# Patient Record
Sex: Female | Born: 1985 | Race: White | Hispanic: No | Marital: Single | Smoking: Never smoker
Health system: Southern US, Community
[De-identification: ages and names within clinical notes are randomized; demographics above are authoritative.]

## PROBLEM LIST (undated history)

## (undated) DIAGNOSIS — E042 Nontoxic multinodular goiter: Secondary | ICD-10-CM

## (undated) DIAGNOSIS — L509 Urticaria, unspecified: Secondary | ICD-10-CM

## (undated) DIAGNOSIS — L309 Dermatitis, unspecified: Secondary | ICD-10-CM

## (undated) DIAGNOSIS — H6990 Unspecified Eustachian tube disorder, unspecified ear: Secondary | ICD-10-CM

## (undated) DIAGNOSIS — E039 Hypothyroidism, unspecified: Secondary | ICD-10-CM

## (undated) DIAGNOSIS — E063 Autoimmune thyroiditis: Secondary | ICD-10-CM

## (undated) DIAGNOSIS — H6980 Other specified disorders of Eustachian tube, unspecified ear: Secondary | ICD-10-CM

## (undated) HISTORY — DX: Autoimmune thyroiditis: E06.3

## (undated) HISTORY — DX: Unspecified eustachian tube disorder, unspecified ear: H69.90

## (undated) HISTORY — DX: Urticaria, unspecified: L50.9

## (undated) HISTORY — DX: Dermatitis, unspecified: L30.9

## (undated) HISTORY — DX: Nontoxic multinodular goiter: E04.2

## (undated) HISTORY — PX: CHOLECYSTECTOMY: SHX55

## (undated) HISTORY — PX: CYSTECTOMY: SUR359

## (undated) HISTORY — PX: WISDOM TOOTH EXTRACTION: SHX21

## (undated) HISTORY — DX: Other specified disorders of Eustachian tube, unspecified ear: H69.80

---

## 2009-05-28 ENCOUNTER — Encounter: Admission: RE | Admit: 2009-05-28 | Discharge: 2009-05-28 | Payer: Self-pay | Admitting: Otolaryngology

## 2012-04-19 DIAGNOSIS — L700 Acne vulgaris: Secondary | ICD-10-CM | POA: Insufficient documentation

## 2012-04-19 DIAGNOSIS — L709 Acne, unspecified: Secondary | ICD-10-CM | POA: Insufficient documentation

## 2012-08-20 HISTORY — PX: THYROIDECTOMY: SHX17

## 2012-11-20 ENCOUNTER — Other Ambulatory Visit: Payer: Self-pay | Admitting: Gastroenterology

## 2012-11-20 DIAGNOSIS — R079 Chest pain, unspecified: Secondary | ICD-10-CM

## 2012-11-30 ENCOUNTER — Ambulatory Visit
Admission: RE | Admit: 2012-11-30 | Discharge: 2012-11-30 | Disposition: A | Discharge status: Home / Self Care | Payer: Managed Care, Other (non HMO) | Source: Ambulatory Visit | Attending: Gastroenterology | Admitting: Gastroenterology

## 2012-11-30 DIAGNOSIS — R079 Chest pain, unspecified: Secondary | ICD-10-CM

## 2012-12-17 ENCOUNTER — Ambulatory Visit
Admission: RE | Admit: 2012-12-17 | Discharge: 2012-12-17 | Disposition: A | Discharge status: Home / Self Care | Payer: Managed Care, Other (non HMO) | Source: Ambulatory Visit | Attending: Gastroenterology | Admitting: Gastroenterology

## 2012-12-17 ENCOUNTER — Other Ambulatory Visit: Payer: Self-pay | Admitting: Gastroenterology

## 2012-12-17 DIAGNOSIS — R079 Chest pain, unspecified: Secondary | ICD-10-CM

## 2014-05-29 ENCOUNTER — Encounter: Payer: Self-pay | Admitting: General Surgery

## 2016-03-11 ENCOUNTER — Ambulatory Visit (INDEPENDENT_AMBULATORY_CARE_PROVIDER_SITE_OTHER): Payer: Managed Care, Other (non HMO) | Admitting: Licensed Clinical Social Worker

## 2016-03-11 DIAGNOSIS — F419 Anxiety disorder, unspecified: Secondary | ICD-10-CM | POA: Diagnosis not present

## 2016-03-25 ENCOUNTER — Ambulatory Visit (INDEPENDENT_AMBULATORY_CARE_PROVIDER_SITE_OTHER): Payer: Managed Care, Other (non HMO) | Admitting: Licensed Clinical Social Worker

## 2016-03-25 DIAGNOSIS — F419 Anxiety disorder, unspecified: Secondary | ICD-10-CM | POA: Diagnosis not present

## 2016-04-11 ENCOUNTER — Ambulatory Visit: Payer: Managed Care, Other (non HMO) | Admitting: Licensed Clinical Social Worker

## 2016-04-22 ENCOUNTER — Ambulatory Visit: Payer: Managed Care, Other (non HMO) | Admitting: Licensed Clinical Social Worker

## 2016-05-06 ENCOUNTER — Ambulatory Visit (INDEPENDENT_AMBULATORY_CARE_PROVIDER_SITE_OTHER): Payer: 59 | Admitting: Licensed Clinical Social Worker

## 2016-05-06 DIAGNOSIS — F419 Anxiety disorder, unspecified: Secondary | ICD-10-CM | POA: Diagnosis not present

## 2016-05-06 LAB — OB RESULTS CONSOLE GC/CHLAMYDIA
CHLAMYDIA, DNA PROBE: NEGATIVE
Gonorrhea: NEGATIVE

## 2016-06-03 LAB — OB RESULTS CONSOLE ABO/RH: RH Type: NEGATIVE

## 2016-06-03 LAB — OB RESULTS CONSOLE ANTIBODY SCREEN: ANTIBODY SCREEN: NEGATIVE

## 2016-06-14 ENCOUNTER — Ambulatory Visit: Payer: Managed Care, Other (non HMO) | Admitting: Licensed Clinical Social Worker

## 2016-06-27 ENCOUNTER — Other Ambulatory Visit (HOSPITAL_COMMUNITY): Payer: Self-pay | Admitting: Obstetrics & Gynecology

## 2016-06-27 DIAGNOSIS — Z3689 Encounter for other specified antenatal screening: Secondary | ICD-10-CM

## 2016-06-27 DIAGNOSIS — Z3A18 18 weeks gestation of pregnancy: Secondary | ICD-10-CM

## 2016-06-27 DIAGNOSIS — R9389 Abnormal findings on diagnostic imaging of other specified body structures: Secondary | ICD-10-CM

## 2016-06-28 ENCOUNTER — Ambulatory Visit (HOSPITAL_COMMUNITY)
Admission: RE | Admit: 2016-06-28 | Discharge: 2016-06-28 | Disposition: A | Discharge status: Home / Self Care | Payer: Managed Care, Other (non HMO) | Source: Ambulatory Visit | Attending: Obstetrics & Gynecology | Admitting: Obstetrics & Gynecology

## 2016-06-28 ENCOUNTER — Encounter (HOSPITAL_COMMUNITY): Payer: Self-pay | Admitting: *Deleted

## 2016-06-28 DIAGNOSIS — Z363 Encounter for antenatal screening for malformations: Secondary | ICD-10-CM | POA: Insufficient documentation

## 2016-06-28 DIAGNOSIS — Z3A18 18 weeks gestation of pregnancy: Secondary | ICD-10-CM | POA: Diagnosis not present

## 2016-06-28 DIAGNOSIS — O358XX0 Maternal care for other (suspected) fetal abnormality and damage, not applicable or unspecified: Secondary | ICD-10-CM | POA: Diagnosis present

## 2016-06-28 DIAGNOSIS — Z3689 Encounter for other specified antenatal screening: Secondary | ICD-10-CM

## 2016-06-28 DIAGNOSIS — R9389 Abnormal findings on diagnostic imaging of other specified body structures: Secondary | ICD-10-CM

## 2016-06-28 NOTE — Addendum Note (Signed)
Encounter addended by: Lenoard Adenlivia M Nahomy Limburg, RT on: 06/28/2016  4:37 PM<BR>    Actions taken: Imaging Exam ended

## 2016-06-29 ENCOUNTER — Other Ambulatory Visit (HOSPITAL_COMMUNITY): Payer: Self-pay | Admitting: *Deleted

## 2016-06-29 DIAGNOSIS — IMO0002 Reserved for concepts with insufficient information to code with codable children: Secondary | ICD-10-CM

## 2016-06-29 DIAGNOSIS — Z0489 Encounter for examination and observation for other specified reasons: Secondary | ICD-10-CM

## 2016-06-30 ENCOUNTER — Encounter (HOSPITAL_COMMUNITY): Payer: Self-pay

## 2016-07-26 ENCOUNTER — Ambulatory Visit (HOSPITAL_COMMUNITY)
Admission: RE | Admit: 2016-07-26 | Discharge: 2016-07-26 | Disposition: A | Discharge status: Home / Self Care | Payer: Managed Care, Other (non HMO) | Source: Ambulatory Visit | Attending: Obstetrics & Gynecology | Admitting: Obstetrics & Gynecology

## 2016-07-26 ENCOUNTER — Encounter (HOSPITAL_COMMUNITY): Payer: Self-pay

## 2016-07-26 DIAGNOSIS — Z3A22 22 weeks gestation of pregnancy: Secondary | ICD-10-CM | POA: Diagnosis not present

## 2016-07-26 DIAGNOSIS — Z362 Encounter for other antenatal screening follow-up: Secondary | ICD-10-CM | POA: Diagnosis present

## 2016-07-26 DIAGNOSIS — Z0489 Encounter for examination and observation for other specified reasons: Secondary | ICD-10-CM

## 2016-07-26 DIAGNOSIS — IMO0002 Reserved for concepts with insufficient information to code with codable children: Secondary | ICD-10-CM

## 2016-08-17 ENCOUNTER — Ambulatory Visit: Payer: Self-pay | Admitting: Licensed Clinical Social Worker

## 2016-08-29 NOTE — L&D Delivery Note (Signed)
Delivery Note  First Stage: Labor onset: 11/28/16 Augmentation : pitocin, AROM  Analgesia /Anesthesia intrapartum: epidural  AROM at 2030  Second Stage: Complete dilation at 0213 Onset of pushing at 0214 FHR second stage Cat 1   Delivery of a viable female at 21 by SNM in ROA compound position no nuchal cord Cord double clamped after cessation of pulsation, cut by FOB Cord blood sample collected   Third Stage: Placenta delivered [facility] intact with 3 VC @ 0409 Placenta disposition: L&D Uterine tone firm / bleeding light  2nd degree laceration identified  Anesthesia for repair: epidural Repair 3.0 vicryl and 4.0 vicryl  Est. Blood Loss (mL): 50  Complications: prodromal labor in early phase- prolonged, compound presentation   Mom to postpartum.  Baby to Couplet care / Skin to Skin.  Newborn: Baby Name: Hank  Birth Weight: pending  Apgar Scores: 9/9 Feeding planned: breast  Steward Drone BSN, SNM 12/01/2016, 4:52 AM

## 2016-09-05 ENCOUNTER — Ambulatory Visit (INDEPENDENT_AMBULATORY_CARE_PROVIDER_SITE_OTHER): Payer: 59 | Admitting: Licensed Clinical Social Worker

## 2016-09-05 DIAGNOSIS — F419 Anxiety disorder, unspecified: Secondary | ICD-10-CM

## 2016-09-05 LAB — OB RESULTS CONSOLE RPR: RPR: NONREACTIVE

## 2016-09-05 LAB — OB RESULTS CONSOLE HEPATITIS B SURFACE ANTIGEN: Hepatitis B Surface Ag: NEGATIVE

## 2016-09-05 LAB — OB RESULTS CONSOLE HIV ANTIBODY (ROUTINE TESTING): HIV: NONREACTIVE

## 2016-09-15 ENCOUNTER — Encounter (HOSPITAL_COMMUNITY): Payer: Self-pay

## 2016-10-05 ENCOUNTER — Ambulatory Visit (INDEPENDENT_AMBULATORY_CARE_PROVIDER_SITE_OTHER): Payer: 59 | Admitting: Licensed Clinical Social Worker

## 2016-10-05 DIAGNOSIS — F419 Anxiety disorder, unspecified: Secondary | ICD-10-CM

## 2016-10-27 ENCOUNTER — Ambulatory Visit: Payer: 59 | Admitting: Licensed Clinical Social Worker

## 2016-10-27 ENCOUNTER — Ambulatory Visit: Payer: Self-pay | Admitting: Licensed Clinical Social Worker

## 2016-11-04 LAB — OB RESULTS CONSOLE GBS: STREP GROUP B AG: NEGATIVE

## 2016-11-10 ENCOUNTER — Ambulatory Visit: Payer: 59 | Admitting: Licensed Clinical Social Worker

## 2016-11-30 ENCOUNTER — Encounter (HOSPITAL_COMMUNITY): Payer: Self-pay

## 2016-11-30 ENCOUNTER — Inpatient Hospital Stay (HOSPITAL_COMMUNITY): Payer: Managed Care, Other (non HMO) | Admitting: Anesthesiology

## 2016-11-30 ENCOUNTER — Inpatient Hospital Stay (HOSPITAL_COMMUNITY)
Admission: AD | Admit: 2016-11-30 | Discharge: 2016-12-03 | DRG: 775 | Disposition: A | Discharge status: Home / Self Care | Payer: Managed Care, Other (non HMO) | Source: Ambulatory Visit | Attending: Obstetrics & Gynecology | Admitting: Obstetrics & Gynecology

## 2016-11-30 DIAGNOSIS — O872 Hemorrhoids in the puerperium: Secondary | ICD-10-CM | POA: Diagnosis not present

## 2016-11-30 DIAGNOSIS — O99284 Endocrine, nutritional and metabolic diseases complicating childbirth: Secondary | ICD-10-CM | POA: Diagnosis present

## 2016-11-30 DIAGNOSIS — Z8249 Family history of ischemic heart disease and other diseases of the circulatory system: Secondary | ICD-10-CM

## 2016-11-30 DIAGNOSIS — O326XX0 Maternal care for compound presentation, not applicable or unspecified: Secondary | ICD-10-CM | POA: Diagnosis present

## 2016-11-30 DIAGNOSIS — E039 Hypothyroidism, unspecified: Secondary | ICD-10-CM | POA: Diagnosis present

## 2016-11-30 DIAGNOSIS — O480 Post-term pregnancy: Secondary | ICD-10-CM | POA: Diagnosis present

## 2016-11-30 DIAGNOSIS — Z3A40 40 weeks gestation of pregnancy: Secondary | ICD-10-CM | POA: Diagnosis not present

## 2016-11-30 DIAGNOSIS — Z823 Family history of stroke: Secondary | ICD-10-CM | POA: Diagnosis not present

## 2016-11-30 LAB — ABO/RH: ABO/RH(D): O NEG

## 2016-11-30 LAB — TYPE AND SCREEN
ABO/RH(D): O NEG
Antibody Screen: NEGATIVE

## 2016-11-30 LAB — CBC
HCT: 38.7 % (ref 36.0–46.0)
Hemoglobin: 13.5 g/dL (ref 12.0–15.0)
MCH: 31.4 pg (ref 26.0–34.0)
MCHC: 34.9 g/dL (ref 30.0–36.0)
MCV: 90 fL (ref 78.0–100.0)
Platelets: 195 10*3/uL (ref 150–400)
RBC: 4.3 MIL/uL (ref 3.87–5.11)
RDW: 13.6 % (ref 11.5–15.5)
WBC: 13.4 10*3/uL — ABNORMAL HIGH (ref 4.0–10.5)

## 2016-11-30 MED ORDER — FENTANYL 2.5 MCG/ML BUPIVACAINE 1/10 % EPIDURAL INFUSION (WH - ANES)
INTRAMUSCULAR | Status: AC
  Filled 2016-11-30: qty 100

## 2016-11-30 MED ORDER — LIDOCAINE HCL (PF) 1 % IJ SOLN
30.0000 mL | INTRAMUSCULAR | Status: DC | PRN
  Filled 2016-11-30: qty 30

## 2016-11-30 MED ORDER — LACTATED RINGERS IV SOLN: 500.0000 mL | INTRAVENOUS | Status: DC | PRN

## 2016-11-30 MED ORDER — OXYTOCIN BOLUS FROM INFUSION: 500.0000 mL | Freq: Once | INTRAVENOUS | Status: DC

## 2016-11-30 MED ORDER — OXYTOCIN 40 UNITS IN LACTATED RINGERS INFUSION - SIMPLE MED
1.0000 m[IU]/min | INTRAVENOUS | Status: DC
  Administered 2016-11-30: 2 m[IU]/min via INTRAVENOUS
  Administered 2016-12-01: 8 m[IU]/min via INTRAVENOUS
  Administered 2016-12-01: 6 m[IU]/min via INTRAVENOUS

## 2016-11-30 MED ORDER — DIPHENHYDRAMINE HCL 50 MG/ML IJ SOLN: 12.5000 mg | INTRAMUSCULAR | Status: DC | PRN

## 2016-11-30 MED ORDER — OXYCODONE-ACETAMINOPHEN 5-325 MG PO TABS
1.0000 | ORAL_TABLET | ORAL | Status: DC | PRN
Start: 2016-11-30 — End: 2016-12-01

## 2016-11-30 MED ORDER — FENTANYL 2.5 MCG/ML BUPIVACAINE 1/10 % EPIDURAL INFUSION (WH - ANES)
14.0000 mL/h | INTRAMUSCULAR | Status: DC | PRN
  Administered 2016-11-30 – 2016-12-01 (×2): 14 mL/h via EPIDURAL
  Filled 2016-11-30: qty 100

## 2016-11-30 MED ORDER — SOD CITRATE-CITRIC ACID 500-334 MG/5ML PO SOLN: 30.0000 mL | ORAL | Status: DC | PRN

## 2016-11-30 MED ORDER — PROMETHAZINE HCL 25 MG/ML IJ SOLN
25.0000 mg | Freq: Once | INTRAMUSCULAR | Status: AC
  Administered 2016-11-30: 25 mg via INTRAVENOUS
  Filled 2016-11-30: qty 1

## 2016-11-30 MED ORDER — BUTORPHANOL TARTRATE 1 MG/ML IJ SOLN
2.0000 mg | Freq: Once | INTRAMUSCULAR | Status: AC
  Administered 2016-11-30: 2 mg via INTRAVENOUS
  Filled 2016-11-30: qty 2

## 2016-11-30 MED ORDER — LIDOCAINE HCL (PF) 1 % IJ SOLN
INTRAMUSCULAR | Status: DC | PRN
  Administered 2016-11-30: 13 mL via EPIDURAL

## 2016-11-30 MED ORDER — EPHEDRINE 5 MG/ML INJ
10.0000 mg | INTRAVENOUS | Status: DC | PRN
  Filled 2016-11-30: qty 2

## 2016-11-30 MED ORDER — OXYCODONE-ACETAMINOPHEN 5-325 MG PO TABS
2.0000 | ORAL_TABLET | ORAL | Status: DC | PRN
Start: 2016-11-30 — End: 2016-12-01

## 2016-11-30 MED ORDER — LACTATED RINGERS IV SOLN
INTRAVENOUS | Status: DC
  Administered 2016-11-30 – 2016-12-01 (×3): via INTRAVENOUS

## 2016-11-30 MED ORDER — PHENYLEPHRINE 40 MCG/ML (10ML) SYRINGE FOR IV PUSH (FOR BLOOD PRESSURE SUPPORT)
PREFILLED_SYRINGE | INTRAVENOUS | Status: AC
  Filled 2016-11-30: qty 20

## 2016-11-30 MED ORDER — LACTATED RINGERS IV SOLN
500.0000 mL | Freq: Once | INTRAVENOUS | Status: AC
  Administered 2016-11-30: 500 mL via INTRAVENOUS

## 2016-11-30 MED ORDER — PHENYLEPHRINE 40 MCG/ML (10ML) SYRINGE FOR IV PUSH (FOR BLOOD PRESSURE SUPPORT)
80.0000 ug | PREFILLED_SYRINGE | INTRAVENOUS | Status: DC | PRN
  Filled 2016-11-30: qty 5

## 2016-11-30 MED ORDER — ACETAMINOPHEN 325 MG PO TABS
650.0000 mg | ORAL_TABLET | ORAL | Status: DC | PRN
  Administered 2016-12-01: 650 mg via ORAL
  Filled 2016-11-30: qty 2

## 2016-11-30 MED ORDER — TERBUTALINE SULFATE 1 MG/ML IJ SOLN
0.2500 mg | Freq: Once | INTRAMUSCULAR | Status: DC | PRN
  Filled 2016-11-30: qty 1

## 2016-11-30 MED ORDER — OXYTOCIN 40 UNITS IN LACTATED RINGERS INFUSION - SIMPLE MED
2.5000 [IU]/h | INTRAVENOUS | Status: DC
  Filled 2016-11-30: qty 1000

## 2016-11-30 NOTE — Anesthesia Pain Management Evaluation Note (Signed)
  CRNA Pain Management Visit Note  Patient: Debbie Patel, 31 y.o., female  "Hello I am a member of the anesthesia team at [facility]. We have an anesthesia team available at all times to provide care throughout the hospital, including epidural management and anesthesia for C-section. I don't know your plan for the delivery whether it a natural birth, water birth, IV sedation, nitrous supplementation, doula or epidural, but we want to meet your pain goals."   1.Was your pain managed to your expectations on prior hospitalizations?   No   2.What is your expectation for pain management during this hospitalization?     Epidural  3.How can we help you reach that goal? ' Epidural when I reach my threshold."  Record the patient's initial score and the patient's pain goal.   Pain: 2  Pain Goal: 8 [facility] wants you to be able to say your pain was always managed very well.  Leeland Lovelady 11/30/2016

## 2016-11-30 NOTE — H&P (Signed)
OB ADMISSION/ HISTORY & PHYSICAL:  Admission Date: 11/30/2016 10:51 AM  Admit Diagnosis: Post dates pregnancy   Debbie Patel is a 31 y.o. female presenting for Post dates pregnancy- prodromal labor.  Prenatal History: G1P0   EDC : 11/26/2016, by Last Menstrual Period  Prenatal care at [facility]  Primary Ob Provider: Marlinda Mike  Prenatal course complicated by hypothyroidism   Prenatal Labs: ABO, Rh: --/--/O NEG, O NEG (04/04 1200) Antibody: NEG (04/04 1200) Rubella:   Immune (05/06/2016)  RPR: Nonreactive (01/08 0000)  HBsAg: Negative (01/08 0000)  HIV: Non-reactive (01/08 0000)  GTT: 89- 1 hour (01/08 0000) GBS: Negative (03/09 0000)   Medical / Surgical History :  Past medical history:  Past Medical History:  Diagnosis Date  . Eustachian tube dysfunction   . Hashimoto's thyroiditis   . Nontoxic multinodular goiter      Past surgical history:  Past Surgical History:  Procedure Laterality Date  . THYROIDECTOMY  08/20/2012  . WISDOM TOOTH EXTRACTION      Family History:  Family History  Problem Relation Age of Onset  . Hypertension Father   . CVA Maternal Grandmother   . Hypothyroidism Maternal Grandmother   . Alzheimer's disease Maternal Grandfather   . Alzheimer's disease Paternal Grandmother   . CAD Paternal Grandfather   . CVA Paternal Grandfather      Social History:  reports that she has never smoked. She has never used smokeless tobacco. She reports that she drinks alcohol. She reports that she does not use drugs.   Allergies: Other    Current Medications at time of admission:  Prior to Admission medications   Medication Sig Start Date End Date Taking? Authorizing Provider  Doxylamine Succinate, Sleep, (UNISOM PO) Take by mouth.    Historical Provider, MD  Folic Acid-Vit B6-Vit B12 (FOLBEE) 2.5-25-1 MG TABS tablet Take 1 tablet by mouth daily.    Historical Provider, MD  levonorgestrel-ethinyl estradiol (NORDETTE) 0.15-30  MG-MCG tablet Take 1 tablet by mouth daily.    Historical Provider, MD  levothyroxine (SYNTHROID, LEVOTHROID) 150 MCG tablet Take 200 mcg by mouth daily before breakfast.     Historical Provider, MD  levothyroxine (SYNTHROID, LEVOTHROID) 200 MCG tablet Take 200 mcg by mouth daily before breakfast. 05/28/16   Historical Provider, MD  Multiple Vitamin (MULTIVITAMIN) tablet Take 1 tablet by mouth daily.    Historical Provider, MD  Prenatal Vit w/Fe-Methylfol-FA (PNV PO) Take by mouth.    Historical Provider, MD     Review of Systems: Active FM onset of ctx on 11/29/16 currently every 5 minutes No LOF No bloody show   Physical Exam:  VS: Blood pressure 125/90, pulse 76, temperature 98.2 F (36.8 C), temperature source Oral, resp. rate 18, height  (1.803 m), weight 99.8 kg (220 lb), last menstrual period 02/20/2016.  General: alert and oriented, appears to feel better after rest  Heart: RRR Lungs: Clear lung fields Abdomen: Gravid, soft and non-tender, non-distended Extremities: mild pedal edema  Genitalia / VE:   Dilation: 2-3  FHR: baseline rate 125 / variability mod / accelerations present / no decelerations TOCO: 5 minutes with UI  Assessment: 40.[redacted] weeks gestation Early stage of labor FHR category 1   Plan:  IV Pain medication Expectant management   Dr Juliene Pina notified of admission / plan of care   Steward Drone BSN, SNM 11/30/2016, 5:56 PM

## 2016-11-30 NOTE — Progress Notes (Signed)
S:  Slept well x 3-4 hours       Ctx tight and painful  O:  VS: Blood pressure 125/90, pulse 76, temperature 98.2 F (36.8 C), temperature source Oral, resp. rate 18, height  (1.803 m), weight 99.8 kg (220 lb), last menstrual period 02/20/2016.        FHR : baseline 125 / variability moderate / accelerations + / no decelerations        Toco: contractions every 5 minutes / UI         Cervix : same: 2-3cm / 80% / vtx 0        Membranes: intact  A: prodromal labor     FHR category 1  P: discussed options - desires to proceed with active management & start pitocin with AROM / wants epidural      Marlinda Mike CNM, MSN, [facility] 11/30/2016, 6:22 PM

## 2016-11-30 NOTE — Anesthesia Preprocedure Evaluation (Signed)
Anesthesia Evaluation  Patient identified by MRN, date of birth, ID band Patient awake    Reviewed: Allergy & Precautions, NPO status , Patient's Chart, lab work & pertinent test results  Airway Mallampati: II  TM Distance: >3 FB Neck ROM: Full    Dental no notable dental hx.    Pulmonary neg pulmonary ROS,    Pulmonary exam normal breath sounds clear to auscultation       Cardiovascular negative cardio ROS Normal cardiovascular exam Rhythm:Regular Rate:Normal     Neuro/Psych negative neurological ROS  negative psych ROS   GI/Hepatic negative GI ROS, Neg liver ROS,   Endo/Other  negative endocrine ROS  Renal/GU negative Renal ROS  negative genitourinary   Musculoskeletal negative musculoskeletal ROS (+)   Abdominal   Peds negative pediatric ROS (+)  Hematology negative hematology ROS (+)   Anesthesia Other Findings   Reproductive/Obstetrics negative OB ROS (+) Pregnancy                             Anesthesia Physical Anesthesia Plan  ASA: II  Anesthesia Plan: Epidural   Post-op Pain Management:    Induction:   Airway Management Planned:   Additional Equipment:   Intra-op Plan:   Post-operative Plan:   Informed Consent:   Plan Discussed with:   Anesthesia Plan Comments:         Anesthesia Quick Evaluation  

## 2016-11-30 NOTE — Progress Notes (Signed)
S:  Some contraction becoming painful - wants epidural  O:  VS: Blood pressure 132/79, pulse 82, temperature 98.2 F (36.8 C), resp. rate 20, height  (1.803 m), weight 99.8 kg (220 lb), last menstrual period 02/20/2016.        FHR : baseline 125 / variability moderate / accelerations + / no decelerations        Toco: contractions every 2-3 minutes / moderate         Cervix : 3cm / 90 vtx 0        Membranes: AROM - moderate clear  A: active labor     FHR category 1  P: epidural     Marlinda Mike CNM, MSN, FACNM 11/30/2016, 8:31 PM

## 2016-11-30 NOTE — Anesthesia Procedure Notes (Signed)
Epidural Patient location during procedure: OB Start time: 11/30/2016 9:11 PM End time: 11/30/2016 9:31 PM  Staffing Anesthesiologist: Anitra Lauth RAY Performed: anesthesiologist   Preanesthetic Checklist Completed: patient identified, site marked, surgical consent, pre-op evaluation, timeout performed, IV checked, risks and benefits discussed and monitors and equipment checked  Epidural Patient position: sitting Prep: DuraPrep Patient monitoring: heart rate, cardiac monitor, continuous pulse ox and blood pressure Approach: midline Location: L2-L3 Injection technique: LOR saline  Needle:  Needle type: Tuohy  Needle gauge: 17 G Needle length: 9 cm Needle insertion depth: 5.5 cm Catheter type: closed end flexible Catheter size: 20 Guage Catheter at skin depth: 8.5 cm Test dose: negative  Assessment Events: blood not aspirated, injection not painful, no injection resistance, negative IV test and no paresthesia  Additional Notes Reason for block:procedure for pain

## 2016-12-01 ENCOUNTER — Encounter (HOSPITAL_COMMUNITY): Payer: Self-pay | Admitting: *Deleted

## 2016-12-01 LAB — CBC
HCT: 34.5 % — ABNORMAL LOW (ref 36.0–46.0)
Hemoglobin: 12.1 g/dL (ref 12.0–15.0)
MCH: 31.8 pg (ref 26.0–34.0)
MCHC: 35.1 g/dL (ref 30.0–36.0)
MCV: 90.6 fL (ref 78.0–100.0)
Platelets: 155 10*3/uL (ref 150–400)
RBC: 3.81 MIL/uL — ABNORMAL LOW (ref 3.87–5.11)
RDW: 13.6 % (ref 11.5–15.5)
WBC: 22.3 10*3/uL — ABNORMAL HIGH (ref 4.0–10.5)

## 2016-12-01 LAB — RPR: RPR Ser Ql: NONREACTIVE

## 2016-12-01 MED ORDER — DIBUCAINE 1 % RE OINT: 1.0000 "application " | TOPICAL_OINTMENT | RECTAL | Status: DC | PRN

## 2016-12-01 MED ORDER — IBUPROFEN 600 MG PO TABS
600.0000 mg | ORAL_TABLET | Freq: Four times a day (QID) | ORAL | Status: DC
  Administered 2016-12-01 – 2016-12-03 (×10): 600 mg via ORAL
  Filled 2016-12-01 (×10): qty 1

## 2016-12-01 MED ORDER — SENNOSIDES-DOCUSATE SODIUM 8.6-50 MG PO TABS
2.0000 | ORAL_TABLET | ORAL | Status: DC
  Administered 2016-12-01 – 2016-12-02 (×2): 2 via ORAL
  Filled 2016-12-01 (×2): qty 2

## 2016-12-01 MED ORDER — PANTOPRAZOLE SODIUM 40 MG PO TBEC
40.0000 mg | DELAYED_RELEASE_TABLET | Freq: Every day | ORAL | Status: DC
  Filled 2016-12-01 (×2): qty 1

## 2016-12-01 MED ORDER — BENZOCAINE-MENTHOL 20-0.5 % EX AERO
1.0000 "application " | INHALATION_SPRAY | CUTANEOUS | Status: DC | PRN
  Administered 2016-12-01: 1 via TOPICAL
  Filled 2016-12-01: qty 56

## 2016-12-01 MED ORDER — ACETAMINOPHEN 325 MG PO TABS: 650.0000 mg | ORAL_TABLET | ORAL | Status: DC | PRN

## 2016-12-01 MED ORDER — LEVOTHYROXINE SODIUM 175 MCG PO TABS
175.0000 ug | ORAL_TABLET | Freq: Every day | ORAL | Status: DC
  Administered 2016-12-01 – 2016-12-03 (×3): 175 ug via ORAL
  Filled 2016-12-01 (×4): qty 1

## 2016-12-01 MED ORDER — DIPHENHYDRAMINE HCL 25 MG PO CAPS: 25.0000 mg | ORAL_CAPSULE | Freq: Four times a day (QID) | ORAL | Status: DC | PRN

## 2016-12-01 MED ORDER — ZOLPIDEM TARTRATE 5 MG PO TABS: 5.0000 mg | ORAL_TABLET | Freq: Every evening | ORAL | Status: DC | PRN

## 2016-12-01 MED ORDER — HYDROCORTISONE ACE-PRAMOXINE 1-1 % RE CREA
TOPICAL_CREAM | Freq: Four times a day (QID) | RECTAL | Status: DC
Start: 2016-12-01 — End: 2016-12-01
  Filled 2016-12-01: qty 30

## 2016-12-01 MED ORDER — ONDANSETRON HCL 4 MG PO TABS: 4.0000 mg | ORAL_TABLET | ORAL | Status: DC | PRN

## 2016-12-01 MED ORDER — SIMETHICONE 80 MG PO CHEW: 80.0000 mg | CHEWABLE_TABLET | ORAL | Status: DC | PRN

## 2016-12-01 MED ORDER — COCONUT OIL OIL
1.0000 "application " | TOPICAL_OIL | Status: DC | PRN
  Administered 2016-12-03: 1 via TOPICAL
  Filled 2016-12-01: qty 120

## 2016-12-01 MED ORDER — WITCH HAZEL-GLYCERIN EX PADS
1.0000 "application " | MEDICATED_PAD | CUTANEOUS | Status: DC | PRN
  Administered 2016-12-01: 1 via TOPICAL

## 2016-12-01 MED ORDER — ONDANSETRON HCL 4 MG/2ML IJ SOLN: 4.0000 mg | INTRAMUSCULAR | Status: DC | PRN

## 2016-12-01 MED ORDER — HYDROCORTISONE ACE-PRAMOXINE 1-1 % RE FOAM
1.0000 | Freq: Four times a day (QID) | RECTAL | Status: DC
  Administered 2016-12-01 – 2016-12-03 (×5): 1 via RECTAL
  Filled 2016-12-01: qty 10

## 2016-12-01 NOTE — Lactation Note (Signed)
This note was copied from a baby's chart. Lactation Consultation Note  Patient Name: Debbie Patel [id number] Date: 12/01/2016 Reason for consult: Follow-up assessment (10 Hours old, encouraged to page with feeding cues )  Baby is 10 hours old and has been to the breast 3 x's since birth.  Per [facility] RN - observed a latch this am and swallows  Noted, Latch score 7 .  [facility] RN - mentioned once the baby latched and got into a good pattern latched well.  Per mom attended the breast feeding classes at [facility] , and had breast feeding questions , along with dad. LC answered questions , and reviewed basics, and potential feeding behaviors.  Importance of STS , depth latch . LC encouraged mom to call with feeding cues for a feeding assessment.  Mother informed of post-discharge support and given phone number to the lactation department, including services for phone call assistance; out-patient appointments; and breastfeeding support group. List of other breastfeeding resources in the community given in the handout. Encouraged mother to call for problems or concerns related to breastfeeding.   Maternal Data Has patient been taught Hand Expression?:  (per mom familiar ) Does the patient have breastfeeding experience prior to this delivery?: No  Feeding Feeding Type:  (per mom attempted at 1300 , few sucks , baby sleepy ) Length of feed:  (few sucks )  LATCH Score/Interventions                Intervention(s): Breastfeeding basics reviewed     Lactation Tools Discussed/Used     Consult Status Consult Status: Follow-up Date: 12/01/16 Follow-up type: In-patient    Matilde Sprang Dodi Leu 12/01/2016, 2:09 PM

## 2016-12-01 NOTE — Lactation Note (Signed)
This note was copied from a baby's chart. Lactation Consultation Note  Patient Name: Debbie Patel [id number] Date: 12/01/2016 Reason for consult: Follow-up assessment Mom called for assist with latching baby. After demonstrating suck training and using breast compression baby was able to latch and demonstrated a good suckling pattern. Basic teaching reviewed with Mom, encouraged to BF with feeding que, cluster feeding discussed. Mom to call for assist as needed with latch.   Maternal Data    Feeding Feeding Type: Breast Fed Length of feed: 15 min  LATCH Score/Interventions Latch: Repeated attempts needed to sustain latch, nipple held in mouth throughout feeding, stimulation needed to elicit sucking reflex. Intervention(s): Adjust position;Assist with latch;Breast massage;Breast compression  Audible Swallowing: A few with stimulation  Type of Nipple: Everted at rest and after stimulation  Comfort (Breast/Nipple): Soft / non-tender     Hold (Positioning): Assistance needed to correctly position infant at breast and maintain latch. Intervention(s): Breastfeeding basics reviewed;Support Pillows;Position options;Skin to skin  LATCH Score: 7  Lactation Tools Discussed/Used     Consult Status Consult Status: Follow-up Date: 12/02/16 Follow-up type: In-patient    Alfred Levins 12/01/2016, 6:00 PM

## 2016-12-01 NOTE — Anesthesia Postprocedure Evaluation (Signed)
Anesthesia Post Note  Patient: Stephonie Wilcoxen  Procedure(s) Performed: * No procedures listed *  Patient location during evaluation: Mother Baby Anesthesia Type: Epidural Level of consciousness: awake Pain management: satisfactory to patient Vital Signs Assessment: post-procedure vital signs reviewed and stable Respiratory status: spontaneous breathing Cardiovascular status: stable Anesthetic complications: no        Last Vitals:  Vitals:   12/01/16 0820 12/01/16 1215  BP: 125/76 116/70  Pulse: 84 82  Resp: 18 16  Temp: 36.6 C 36.7 C    Last Pain:  Vitals:   12/01/16 1215  TempSrc: Oral  PainSc: 2    Pain Goal: Patients Stated Pain Goal: 2 (12/01/16 1215)               Cephus Shelling

## 2016-12-01 NOTE — Plan of Care (Signed)
Problem: Education: Goal: Knowledge of condition will improve Admission paperwork, safety and unit protocols reviewed with patient and significant other. Patient verbalizes understanding of information. Encouraged patient to call for staff assistance for the first time ambulating.

## 2016-12-01 NOTE — Progress Notes (Signed)
Patient transferred to [facility] rm 101

## 2016-12-02 NOTE — Progress Notes (Signed)
Post Partum Day 1, SVD (CNM care) on 4/5 AM. BOY.  Subjective: no complaints, up ad lib, voiding, tolerating PO and + flatus Some perineal pain and hemorrhoids related discomfort Bleeding better after passing a few clots immediate pp   Objective: Blood pressure 117/61, pulse 61, temperature 98.2 F (36.8 C), temperature source Oral, resp. rate 18, height  (1.803 m), weight 220 lb (99.8 kg), last menstrual period 02/20/2016, SpO2 98 %, unknown if currently breastfeeding.  Physical Exam:  General: alert and cooperative Lochia: appropriate Uterine Fundus: firm Incision: ok DVT Evaluation: No evidence of DVT seen on physical exam.   Recent Labs  11/30/16 1200 12/01/16 0726  HGB 13.5 12.1  HCT 38.7 34.5*   O(-), Baby also O(-)  Defer rhogam Rub Immune    Assessment/Plan: PPD1. Healthy mother, uncomplicated SVD, BOY. Breast feeding  Plan for discharge tomorrow, Breastfeeding, Lactation consult and Circumcision prior to discharge Circumcision reviewed, incl risks/ complications/ benefit and option to do it later in life. Plan to proceed today when cleared by Peds.   PP care reviewed including reportable s./s, breast feeding care and support.    LOS: 2 days   [name] 12/02/2016, 10:30 AM

## 2016-12-02 NOTE — Lactation Note (Signed)
This note was copied from a baby's chart. Lactation Consultation Note  Patient Name: Debbie Patel [id number] Date: 12/02/2016 Reason for consult: Follow-up assessment Baby at 34 hr of life. Baby "just got back from circumcision" . Mom requested help with football position. Mom reports her L nipple is not as erect as the R nipple. Showed Mom how to "tea cup" the areola to get baby on more deeply. Demonstrated manual expression, colostrum noted bilaterally, spoon at bedside. Discussed baby behavior, feeding frequency, baby belly size, voids, wt loss, breast changes, and nipple care. Parents are aware of lactation services and support group. Mom will offer the breast on demand 8+/24hr, post express, and offer expressed milk per volume guidelines.      Maternal Data Has patient been taught Hand Expression?: Yes  Feeding Feeding Type: Breast Fed  LATCH Score/Interventions Latch: Repeated attempts needed to sustain latch, nipple held in mouth throughout feeding, stimulation needed to elicit sucking reflex. Intervention(s): Adjust position;Assist with latch  Audible Swallowing: A few with stimulation Intervention(s): Skin to skin Intervention(s): Hand expression  Type of Nipple: Everted at rest and after stimulation  Comfort (Breast/Nipple): Soft / non-tender     Hold (Positioning): Assistance needed to correctly position infant at breast and maintain latch. Intervention(s): Position options;Support Pillows  LATCH Score: 7  Lactation Tools Discussed/Used WIC Program: No   Consult Status Consult Status: Follow-up Date: 12/03/16 Follow-up type: In-patient    Rulon Eisenmenger 12/02/2016, 2:31 PM

## 2016-12-03 DIAGNOSIS — E039 Hypothyroidism, unspecified: Secondary | ICD-10-CM | POA: Diagnosis present

## 2016-12-03 MED ORDER — HYDROCORTISONE ACE-PRAMOXINE 1-1 % RE FOAM: 1.0000 | Freq: Four times a day (QID) | RECTAL | 0 refills | Status: DC

## 2016-12-03 MED ORDER — BENZOCAINE-MENTHOL 20-0.5 % EX AERO: 1.0000 "application " | INHALATION_SPRAY | CUTANEOUS | Status: DC | PRN

## 2016-12-03 MED ORDER — IBUPROFEN 600 MG PO TABS: 600.0000 mg | ORAL_TABLET | Freq: Four times a day (QID) | ORAL | 0 refills | Status: DC

## 2016-12-03 MED ORDER — COCONUT OIL OIL: 1.0000 "application " | TOPICAL_OIL | 0 refills | Status: DC | PRN

## 2016-12-03 NOTE — Lactation Note (Signed)
This note was copied from a baby's chart. Lactation Consultation Note  Mother requested IBCLC to assist with latching related to nipple pain. Many attempts were made at positioning without much success. She finally achieved comfort when the breast was compressed and supported behind the areola.Breast compressions used to aid in transfer. Swallows noted. Baby is noted to chew at the breast. Suck evaluation reveals sensitive gag reflex, snapback when finger is pulled deeply into the oropharynx and little lateralization to the left. Encouraged parents to perform tongue exercises to facilitated better ROM. Parents were grateful for assistance. Follow-up as needed. Patient Name: Debbie Patel Today's Date: 12/03/2016     Maternal Data    Feeding Feeding Type: Breast Fed Length of feed: 50 min  LATCH Score/Interventions Latch: Repeated attempts needed to sustain latch, nipple held in mouth throughout feeding, stimulation needed to elicit sucking reflex.  Audible Swallowing: A few with stimulation  Type of Nipple: Everted at rest and after stimulation  Comfort (Breast/Nipple): Filling, red/small blisters or bruises, mild/mod discomfort     Hold (Positioning): No assistance needed to correctly position infant at breast.  LATCH Score: 7  Lactation Tools Discussed/Used     Consult Status      Soyla Dryer 12/03/2016, 3:38 PM

## 2016-12-03 NOTE — Lactation Note (Signed)
This note was copied from a baby's chart. Lactation Consultation Note  Mother is concerned about nipple tenderness.  Reviewed latch and positioning to improve comfort.  Nipples are abraded so comfort gels were given to aid healing. Also instructed  Mom to use her milk as it has anti-infective properties. 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 Anticipatory guidance given related to this condition and possible consequences if her synthroid levels are not in range. Counseled her to follow-up with [facility] as needed. Aware of [facility] support groups and outpatient services.  Patient Name: Debbie Patel [id number] Date: 12/03/2016 Reason for consult: Follow-up assessment   Maternal Data    Feeding Feeding Type: Breast Fed Length of feed: 40 min  LATCH Score/Interventions                      Lactation Tools Discussed/Used     Consult Status Consult Status: PRN Follow-up type: Other (comment) (Barb Carder)    Soyla Dryer 12/03/2016, 10:40 AM

## 2016-12-03 NOTE — Discharge Summary (Signed)
Obstetric Discharge Summary Reason for Admission: induction of labor Prenatal Procedures: ultrasound Intrapartum Procedures: spontaneous vaginal delivery and epidural Postpartum Procedures: none Complications-Operative and Postpartum: 2nd degree perineal laceration Hemoglobin  Date Value Ref Range Status  12/01/2016 12.1 12.0 - 15.0 g/dL Final   HCT  Date Value Ref Range Status  12/01/2016 34.5 (L) 36.0 - 46.0 % Final    Physical Exam:  General: alert, cooperative and no distress Lochia: appropriate Uterine Fundus: firm Incision: healing well DVT Evaluation: No cords or calf tenderness. No significant calf/ankle edema.  Discharge Diagnoses: Term Pregnancy-delivered and hypothyroidism and hemorrhoids / non-thrombosed  Discharge Information: Date: 12/03/2016 Activity: pelvic rest Diet: routine Medications: PNV, Ibuprofen and levothyroxine Condition: stable Instructions: refer to practice specific booklet Discharge to: home Follow-up Information    Marlinda Mike, CNM. Schedule an appointment as soon as possible for a visit in 6 week(s).   Specialty:  Obstetrics and Gynecology Contact information: [address] [place] [address] [phone]           Newborn Data: Live born female Hank Birth Weight: 7 lb 4.1 oz (3291 g) APGAR: 9, 9  Home with mother.  Neta Mends 12/03/2016, 10:48 AM

## 2017-01-03 ENCOUNTER — Ambulatory Visit (INDEPENDENT_AMBULATORY_CARE_PROVIDER_SITE_OTHER): Payer: Managed Care, Other (non HMO) | Admitting: Licensed Clinical Social Worker

## 2017-01-03 DIAGNOSIS — F419 Anxiety disorder, unspecified: Secondary | ICD-10-CM | POA: Diagnosis not present

## 2017-02-07 ENCOUNTER — Ambulatory Visit (INDEPENDENT_AMBULATORY_CARE_PROVIDER_SITE_OTHER): Payer: Managed Care, Other (non HMO) | Admitting: Licensed Clinical Social Worker

## 2017-02-07 DIAGNOSIS — F419 Anxiety disorder, unspecified: Secondary | ICD-10-CM

## 2017-03-02 ENCOUNTER — Ambulatory Visit (INDEPENDENT_AMBULATORY_CARE_PROVIDER_SITE_OTHER): Payer: Managed Care, Other (non HMO) | Admitting: Licensed Clinical Social Worker

## 2017-03-02 DIAGNOSIS — F419 Anxiety disorder, unspecified: Secondary | ICD-10-CM

## 2017-04-06 ENCOUNTER — Other Ambulatory Visit: Payer: Self-pay | Admitting: Internal Medicine

## 2017-04-06 DIAGNOSIS — R1084 Generalized abdominal pain: Secondary | ICD-10-CM

## 2017-04-06 DIAGNOSIS — R748 Abnormal levels of other serum enzymes: Secondary | ICD-10-CM

## 2017-04-12 ENCOUNTER — Ambulatory Visit: Payer: Managed Care, Other (non HMO) | Admitting: Licensed Clinical Social Worker

## 2017-04-13 ENCOUNTER — Ambulatory Visit
Admission: RE | Admit: 2017-04-13 | Discharge: 2017-04-13 | Disposition: A | Discharge status: Home / Self Care | Payer: Managed Care, Other (non HMO) | Source: Ambulatory Visit | Attending: Internal Medicine | Admitting: Internal Medicine

## 2017-04-13 DIAGNOSIS — R748 Abnormal levels of other serum enzymes: Secondary | ICD-10-CM

## 2017-04-13 DIAGNOSIS — R1084 Generalized abdominal pain: Secondary | ICD-10-CM

## 2017-05-08 ENCOUNTER — Ambulatory Visit (INDEPENDENT_AMBULATORY_CARE_PROVIDER_SITE_OTHER): Payer: Managed Care, Other (non HMO) | Admitting: Licensed Clinical Social Worker

## 2017-05-08 DIAGNOSIS — F419 Anxiety disorder, unspecified: Secondary | ICD-10-CM

## 2017-05-29 HISTORY — PX: CHOLECYSTECTOMY: SHX55

## 2017-06-07 ENCOUNTER — Ambulatory Visit: Payer: Managed Care, Other (non HMO) | Admitting: Licensed Clinical Social Worker

## 2017-06-13 ENCOUNTER — Other Ambulatory Visit: Payer: Self-pay | Admitting: General Surgery

## 2017-07-03 ENCOUNTER — Ambulatory Visit: Payer: Managed Care, Other (non HMO) | Admitting: Licensed Clinical Social Worker

## 2017-07-18 ENCOUNTER — Ambulatory Visit (INDEPENDENT_AMBULATORY_CARE_PROVIDER_SITE_OTHER): Payer: Managed Care, Other (non HMO) | Admitting: Licensed Clinical Social Worker

## 2017-07-18 DIAGNOSIS — F419 Anxiety disorder, unspecified: Secondary | ICD-10-CM

## 2017-08-30 ENCOUNTER — Ambulatory Visit: Payer: Managed Care, Other (non HMO) | Admitting: Licensed Clinical Social Worker

## 2017-09-20 ENCOUNTER — Ambulatory Visit (INDEPENDENT_AMBULATORY_CARE_PROVIDER_SITE_OTHER): Payer: 59 | Admitting: Licensed Clinical Social Worker

## 2017-09-20 DIAGNOSIS — F419 Anxiety disorder, unspecified: Secondary | ICD-10-CM

## 2017-10-26 ENCOUNTER — Ambulatory Visit: Payer: 59 | Admitting: Licensed Clinical Social Worker

## 2017-11-28 DIAGNOSIS — E890 Postprocedural hypothyroidism: Secondary | ICD-10-CM | POA: Insufficient documentation

## 2018-01-03 ENCOUNTER — Ambulatory Visit (INDEPENDENT_AMBULATORY_CARE_PROVIDER_SITE_OTHER): Payer: 59 | Admitting: Licensed Clinical Social Worker

## 2018-01-03 DIAGNOSIS — F419 Anxiety disorder, unspecified: Secondary | ICD-10-CM

## 2018-03-08 ENCOUNTER — Ambulatory Visit: Payer: 59 | Admitting: Licensed Clinical Social Worker

## 2018-04-03 ENCOUNTER — Ambulatory Visit: Payer: 59 | Admitting: Licensed Clinical Social Worker

## 2018-10-16 IMAGING — US US MFM OB FOLLOW-UP
1 series · 14 of 28 positions shown · non-contrast
Comparison: none

[Series 1: us mfm ob follow-up · 77 acquisitions, 14 frames shown]
[im 3/77]
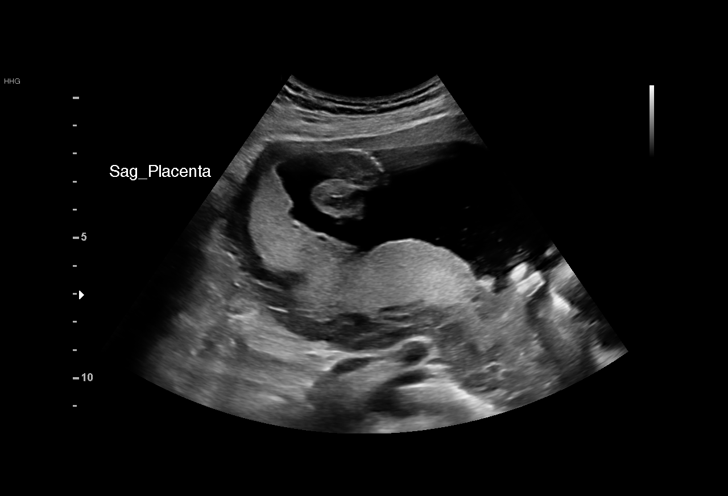
[im 9/77]
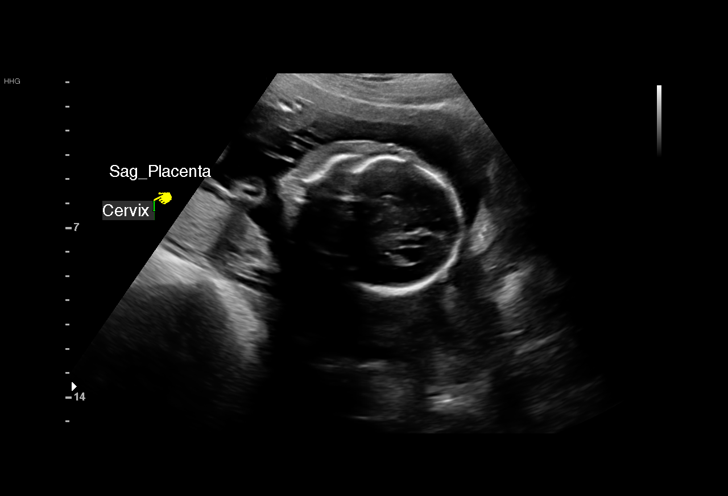
[im 15/77]
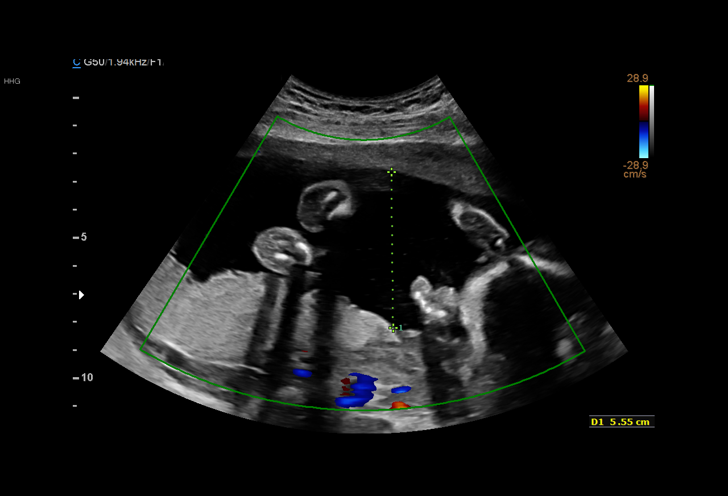
[im 20/77]
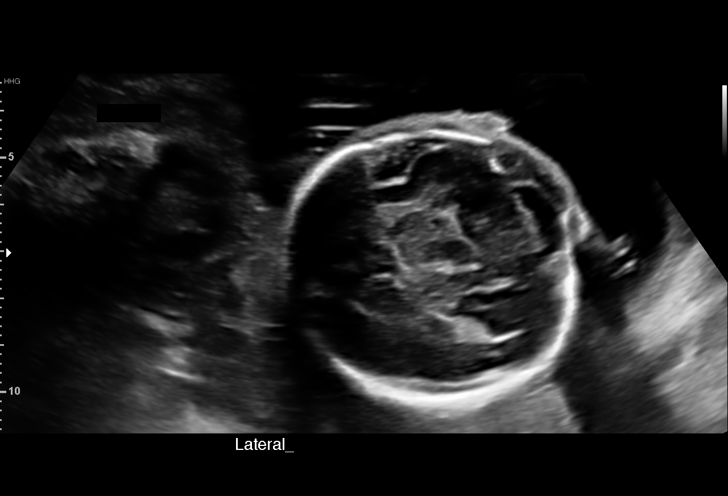
[im 26/77]
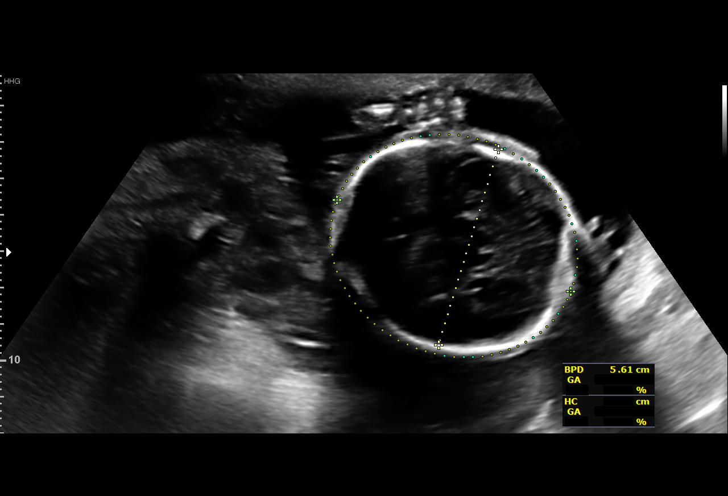
[im 31/77]
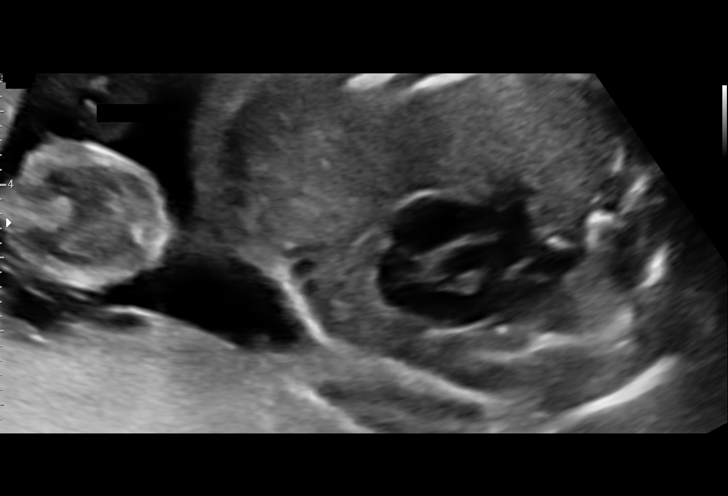
[im 37/77]
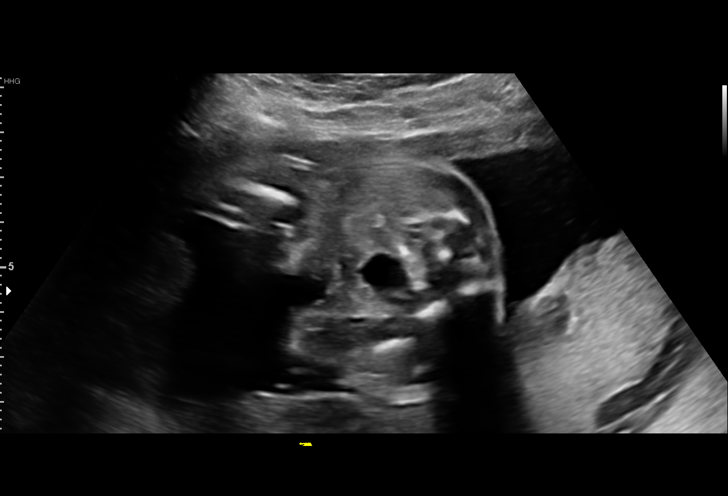
[im 43/77]
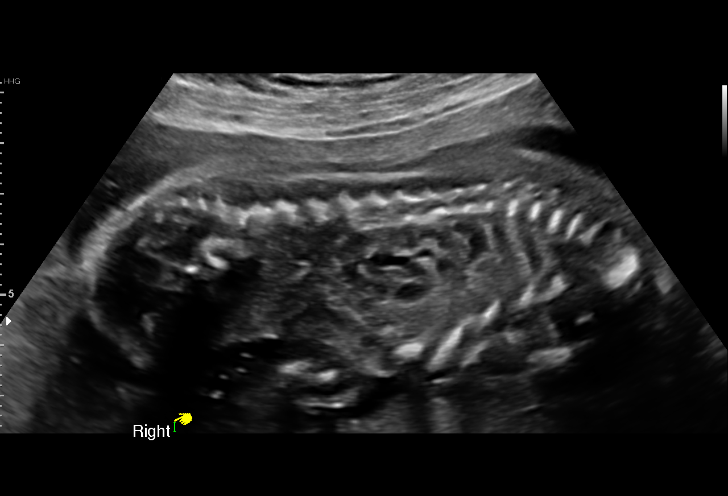
[im 48/77]
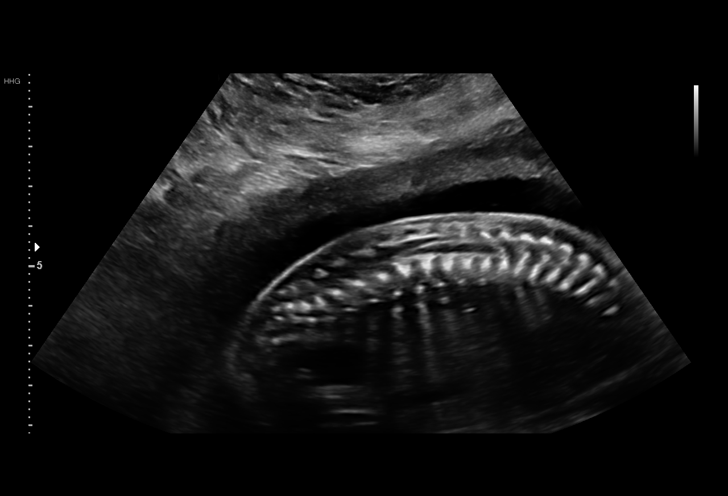
[im 54/77]
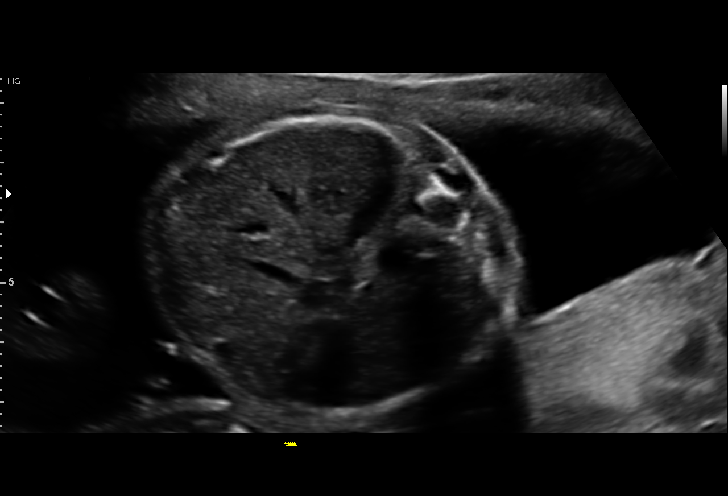
[im 60/77]
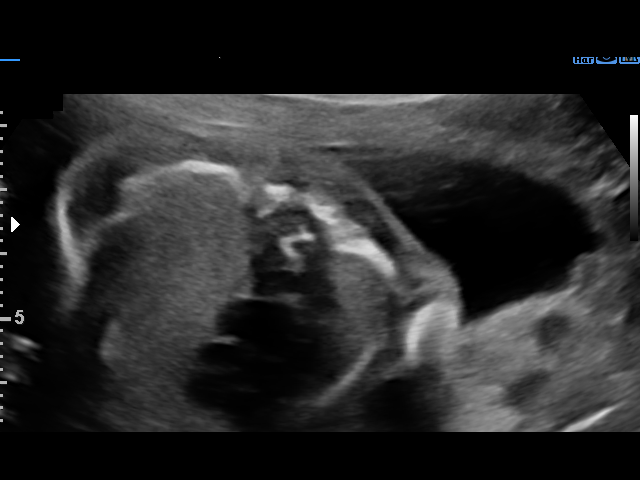
[im 65/77]
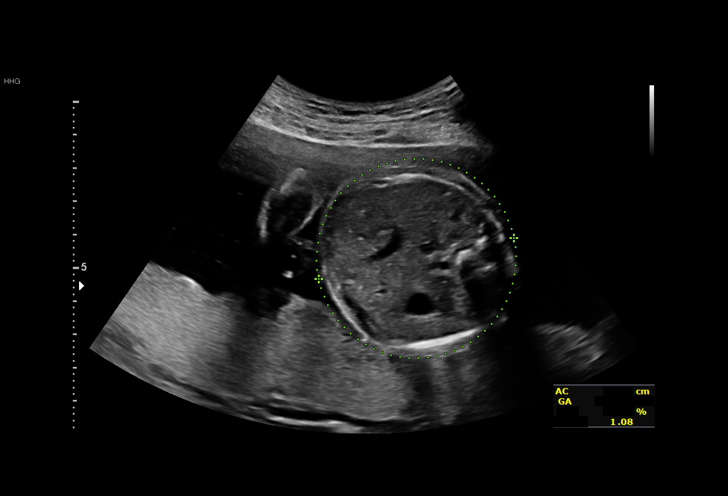
[im 71/77]
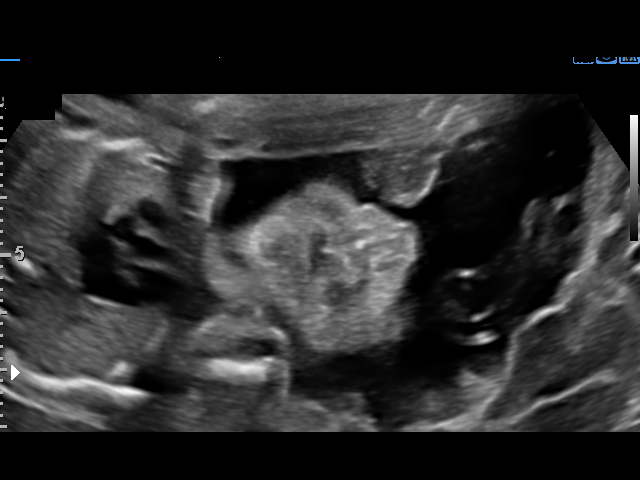
[im 77/77]
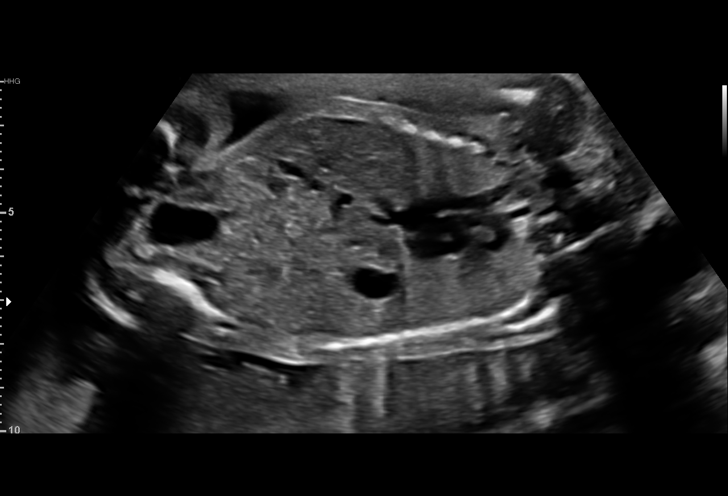

[14 of 28 positions shown; findings below may reference images not displayed]

and Infertility
[id number]
CARLOS FREITAS

[address]  [address]              [id number]      [id number]     [id number]
Indications

22 weeks gestation of pregnancy
Antenatal follow-up for nonvisualized fetal
anatomy
Thyroid disease in pregnancy                   O99.280,
OB History

Gravidity:    1         Term:   0        Prem:   0        SAB:   0
TOP:          0       Ectopic:  0        Living: 0
Fetal Evaluation

Num Of Fetuses:     1
Fetal Heart         158
Rate(bpm):
Cardiac Activity:   Observed
Presentation:       Cephalic
Placenta:           Posterior, above cervical os
P. Cord Insertion:  Visualized, central

Amniotic Fluid
AFI FV:      Subjectively within normal limits

Largest Pocket(cm)
5.6
Biometry

BPD:      56.3  mm     G. Age:  23w 1d         75  %    CI:        77.73   %   70 - 86
FL/HC:      20.1   %   18.4 -
HC:      202.1  mm     G. Age:  22w 2d         34  %    HC/AC:      1.05       1.06 -
AC:       192   mm     G. Age:  23w 6d         84  %    FL/BPD:     72.1   %   71 - 87
FL:       40.6  mm     G. Age:  23w 1d         63  %    FL/AC:      21.1   %   20 - 24
HUM:      38.7  mm     G. Age:  23w 5d         77  %
LV:        5.9  mm

Est. FW:     595  gm      1 lb 5 oz     65  %
Gestational Age

LMP:           22w 3d       Date:   02/20/16                 EDD:   11/26/16
U/S Today:     23w 1d                                        EDD:   11/21/16
Best:          22w 3d    Det. By:   LMP  (02/20/16)          EDD:   11/26/16
Anatomy

Cranium:               Appears normal         Aortic Arch:            Previously seen
Cavum:                 Appears normal         Ductal Arch:            Previously seen
Ventricles:            Appears normal         Diaphragm:              Appears normal
Choroid Plexus:        Appears normal         Stomach:                Appears normal, left
sided
Cerebellum:            Previously seen        Abdomen:                Appears normal
Posterior Fossa:       Previously seen        Abdominal Wall:         Previously seen
Nuchal Fold:           Not applicable (>20    Cord Vessels:           Previously seen
wks GA)
Face:                  Appears normal         Kidneys:                Appear normal
(orbits and profile)
Lips:                  Appears normal         Bladder:                Appears normal
Thoracic:              Appears normal         Spine:                  Appears normal
Heart:                 Appears normal         Upper Extremities:      Appears normal
(4CH, axis, and situs
RVOT:                  Appears normal         Lower Extremities:      Appears normal
LVOT:                  Appears normal

Other:  Parents do not wish to know sex of fetus. Nasal bone visualized.
Cervix Uterus Adnexa

Cervix
Length:           3.32  cm.
Normal appearance by transabdominal scan.

Left Ovary
Not visualized.

Right Ovary
Not visualized.

Adnexa:       No adnexal mass visualized.
Impression

Singleton intrauterine pregnancy at 22+3 weeks, to complete
anatomic survey
Review of the anatomy shows no sonographic markers for
aneuploidy or structural anomalies
Cardiac anatomy was well visualized today
Amniotic fluid volume is normal
Estimated fetal weight is 595g which is growth in the 65th
percentile
Recommendations

No further visits are necessary in MFM. she should have
repeat evaluations of growth about 28 and 34 weeks due to
her postsurgical hypothyroidism. these can be done either in
your office or ours, at your discretion

## 2018-10-17 ENCOUNTER — Ambulatory Visit: Payer: 59 | Admitting: Psychology

## 2018-10-24 ENCOUNTER — Ambulatory Visit: Payer: 59 | Admitting: Psychology

## 2018-10-24 DIAGNOSIS — F4321 Adjustment disorder with depressed mood: Secondary | ICD-10-CM

## 2018-11-09 ENCOUNTER — Other Ambulatory Visit: Payer: Self-pay

## 2018-11-09 ENCOUNTER — Ambulatory Visit: Payer: 59 | Admitting: Psychology

## 2018-11-09 DIAGNOSIS — F4321 Adjustment disorder with depressed mood: Secondary | ICD-10-CM | POA: Diagnosis not present

## 2018-11-22 ENCOUNTER — Ambulatory Visit (INDEPENDENT_AMBULATORY_CARE_PROVIDER_SITE_OTHER): Payer: 59 | Admitting: Psychology

## 2018-11-22 DIAGNOSIS — F4321 Adjustment disorder with depressed mood: Secondary | ICD-10-CM | POA: Diagnosis not present

## 2018-11-27 ENCOUNTER — Ambulatory Visit (INDEPENDENT_AMBULATORY_CARE_PROVIDER_SITE_OTHER): Payer: 59 | Admitting: Licensed Clinical Social Worker

## 2018-11-27 DIAGNOSIS — F419 Anxiety disorder, unspecified: Secondary | ICD-10-CM

## 2018-12-06 ENCOUNTER — Ambulatory Visit (INDEPENDENT_AMBULATORY_CARE_PROVIDER_SITE_OTHER): Payer: 59 | Admitting: Psychology

## 2018-12-06 DIAGNOSIS — F4321 Adjustment disorder with depressed mood: Secondary | ICD-10-CM | POA: Diagnosis not present

## 2018-12-12 ENCOUNTER — Ambulatory Visit (INDEPENDENT_AMBULATORY_CARE_PROVIDER_SITE_OTHER): Payer: 59 | Admitting: Licensed Clinical Social Worker

## 2018-12-12 DIAGNOSIS — F419 Anxiety disorder, unspecified: Secondary | ICD-10-CM | POA: Diagnosis not present

## 2018-12-20 ENCOUNTER — Ambulatory Visit: Payer: 59 | Admitting: Psychology

## 2018-12-27 ENCOUNTER — Ambulatory Visit: Payer: 59 | Admitting: Psychology

## 2018-12-27 ENCOUNTER — Ambulatory Visit: Payer: 59 | Admitting: Licensed Clinical Social Worker

## 2018-12-28 ENCOUNTER — Ambulatory Visit (INDEPENDENT_AMBULATORY_CARE_PROVIDER_SITE_OTHER): Payer: 59 | Admitting: Psychology

## 2018-12-28 DIAGNOSIS — F4321 Adjustment disorder with depressed mood: Secondary | ICD-10-CM

## 2019-01-02 ENCOUNTER — Ambulatory Visit (INDEPENDENT_AMBULATORY_CARE_PROVIDER_SITE_OTHER): Payer: 59 | Admitting: Licensed Clinical Social Worker

## 2019-01-02 DIAGNOSIS — F3341 Major depressive disorder, recurrent, in partial remission: Secondary | ICD-10-CM

## 2019-01-18 ENCOUNTER — Ambulatory Visit (INDEPENDENT_AMBULATORY_CARE_PROVIDER_SITE_OTHER): Payer: 59 | Admitting: Psychology

## 2019-01-18 DIAGNOSIS — F4321 Adjustment disorder with depressed mood: Secondary | ICD-10-CM | POA: Diagnosis not present

## 2019-01-23 ENCOUNTER — Ambulatory Visit (INDEPENDENT_AMBULATORY_CARE_PROVIDER_SITE_OTHER): Payer: 59 | Admitting: Licensed Clinical Social Worker

## 2019-01-23 DIAGNOSIS — F419 Anxiety disorder, unspecified: Secondary | ICD-10-CM

## 2019-02-01 ENCOUNTER — Ambulatory Visit (INDEPENDENT_AMBULATORY_CARE_PROVIDER_SITE_OTHER): Payer: 59 | Admitting: Psychology

## 2019-02-01 DIAGNOSIS — F4321 Adjustment disorder with depressed mood: Secondary | ICD-10-CM | POA: Diagnosis not present

## 2019-02-26 ENCOUNTER — Ambulatory Visit (INDEPENDENT_AMBULATORY_CARE_PROVIDER_SITE_OTHER): Payer: 59 | Admitting: Licensed Clinical Social Worker

## 2019-02-26 DIAGNOSIS — F419 Anxiety disorder, unspecified: Secondary | ICD-10-CM | POA: Diagnosis not present

## 2019-02-27 ENCOUNTER — Ambulatory Visit: Payer: 59 | Admitting: Psychology

## 2019-03-13 ENCOUNTER — Ambulatory Visit: Payer: 59 | Admitting: Psychology

## 2019-03-27 ENCOUNTER — Other Ambulatory Visit: Payer: Self-pay | Admitting: Obstetrics and Gynecology

## 2019-03-27 ENCOUNTER — Ambulatory Visit: Payer: 59 | Admitting: Licensed Clinical Social Worker

## 2019-03-27 ENCOUNTER — Ambulatory Visit (INDEPENDENT_AMBULATORY_CARE_PROVIDER_SITE_OTHER): Payer: 59 | Admitting: Psychology

## 2019-03-27 DIAGNOSIS — N6320 Unspecified lump in the left breast, unspecified quadrant: Secondary | ICD-10-CM

## 2019-03-27 DIAGNOSIS — F4321 Adjustment disorder with depressed mood: Secondary | ICD-10-CM | POA: Diagnosis not present

## 2019-04-02 ENCOUNTER — Other Ambulatory Visit: Payer: Self-pay

## 2019-04-02 ENCOUNTER — Ambulatory Visit
Admission: RE | Admit: 2019-04-02 | Discharge: 2019-04-02 | Disposition: A | Discharge status: Home / Self Care | Payer: 59 | Source: Ambulatory Visit | Attending: Obstetrics and Gynecology | Admitting: Obstetrics and Gynecology

## 2019-04-02 DIAGNOSIS — N6320 Unspecified lump in the left breast, unspecified quadrant: Secondary | ICD-10-CM

## 2019-04-23 ENCOUNTER — Ambulatory Visit: Payer: 59 | Admitting: Licensed Clinical Social Worker

## 2019-04-28 DIAGNOSIS — Z9049 Acquired absence of other specified parts of digestive tract: Secondary | ICD-10-CM | POA: Insufficient documentation

## 2019-05-01 ENCOUNTER — Ambulatory Visit: Payer: 59 | Admitting: Psychology

## 2019-05-13 ENCOUNTER — Ambulatory Visit (INDEPENDENT_AMBULATORY_CARE_PROVIDER_SITE_OTHER): Payer: 59 | Admitting: Psychology

## 2019-05-13 DIAGNOSIS — F4321 Adjustment disorder with depressed mood: Secondary | ICD-10-CM

## 2019-06-05 ENCOUNTER — Ambulatory Visit: Payer: 59 | Admitting: Psychology

## 2019-06-25 ENCOUNTER — Ambulatory Visit (INDEPENDENT_AMBULATORY_CARE_PROVIDER_SITE_OTHER): Payer: 59 | Admitting: Psychology

## 2019-06-25 DIAGNOSIS — F4321 Adjustment disorder with depressed mood: Secondary | ICD-10-CM

## 2019-07-15 ENCOUNTER — Ambulatory Visit: Payer: 59 | Admitting: Psychology

## 2019-07-31 ENCOUNTER — Ambulatory Visit: Payer: 59 | Admitting: Psychology

## 2019-09-04 ENCOUNTER — Ambulatory Visit: Payer: 59 | Admitting: Psychology

## 2019-09-06 ENCOUNTER — Ambulatory Visit (INDEPENDENT_AMBULATORY_CARE_PROVIDER_SITE_OTHER): Payer: 59

## 2019-09-06 ENCOUNTER — Other Ambulatory Visit: Payer: Self-pay

## 2019-09-06 ENCOUNTER — Ambulatory Visit: Payer: 59 | Admitting: Podiatry

## 2019-09-06 VITALS — BP 110/70 | HR 68 | Temp 97.7°F

## 2019-09-06 DIAGNOSIS — M7732 Calcaneal spur, left foot: Secondary | ICD-10-CM

## 2019-09-06 DIAGNOSIS — M79671 Pain in right foot: Secondary | ICD-10-CM

## 2019-09-06 DIAGNOSIS — M7731 Calcaneal spur, right foot: Secondary | ICD-10-CM | POA: Diagnosis not present

## 2019-09-06 DIAGNOSIS — M24573 Contracture, unspecified ankle: Secondary | ICD-10-CM | POA: Diagnosis not present

## 2019-09-06 DIAGNOSIS — M79672 Pain in left foot: Secondary | ICD-10-CM

## 2019-09-06 DIAGNOSIS — M722 Plantar fascial fibromatosis: Secondary | ICD-10-CM

## 2019-09-06 NOTE — Patient Instructions (Signed)

## 2019-09-06 NOTE — Progress Notes (Signed)
  Subjective:  Patient ID: Debbie Patel, female    DOB: 1985-12-11,  MRN: [id number]  Chief Complaint  Patient presents with  . Nail Problem    Bilateral hallux, 2nd L toe. "Couple of years". Pt stated, "I've noticed that the nails have been gradually rising from the nail bed".  . Foot Problem    Bilateral hallux. Pt stated, "I've danced for many years and worn pointe shoes. I have noticed that my toes curve [laterally]. I was wondering if this could cause any problems long-term".  . Arch Pain    R foot. x8-9 months. Pt stated, "I have been running, and developed pain in the arch. I've tried arch support [employer] shoes, which have helped. I also had a stress fracture years ago".    34 y.o. female presents with the above complaint. History confirmed with patient.   Objective:  Physical Exam: warm, good capillary refill, nail exam distal hallux lysis and dysrtophy, no trophic changes or ulcerative lesions, normal DP and PT pulses and normal sensory exam. Left Foot: point tenderness over the heel pad  Right Foot: point tenderness of the mid plantar fascia   No images are attached to the encounter.  Radiographs: X-ray of both feet: no fracture, dislocation, swelling or degenerative changes noted. Old stress fracture left 2nd metatarsal. Heel spurs bilat  Assessment:   1. Equinus contracture of ankle   2. Plantar fasciitis   3. Calcaneal spur of left foot   4. Heel spur, right   5. Pain of left heel    Plan:  Patient was evaluated and treated and all questions answered.  Plantar Fasciitis and Pes Planus -X-rays reviewed as above -Educated patient on stretching and icing of the affected limb -Plantar fascial brace dispensed  -Discussed proper shoegear  Onychodystrophy -Nails debrided, leading edge roughed to allow nail to grow to nail bed. Discussed keeping nails short.  Total time spent on encounter: 35 mins  Return in about 6 weeks (around 10/18/2019).

## 2019-10-02 ENCOUNTER — Ambulatory Visit: Payer: 59 | Admitting: Psychology

## 2019-10-10 ENCOUNTER — Other Ambulatory Visit: Payer: Self-pay | Admitting: Podiatry

## 2019-10-10 DIAGNOSIS — M24573 Contracture, unspecified ankle: Secondary | ICD-10-CM

## 2019-10-31 DIAGNOSIS — E063 Autoimmune thyroiditis: Secondary | ICD-10-CM | POA: Insufficient documentation

## 2019-10-31 DIAGNOSIS — Z8639 Personal history of other endocrine, nutritional and metabolic disease: Secondary | ICD-10-CM | POA: Insufficient documentation

## 2019-11-07 ENCOUNTER — Ambulatory Visit: Payer: 59 | Admitting: Podiatry

## 2019-11-07 ENCOUNTER — Other Ambulatory Visit: Payer: Self-pay

## 2019-11-07 DIAGNOSIS — L603 Nail dystrophy: Secondary | ICD-10-CM | POA: Diagnosis not present

## 2019-11-07 NOTE — Progress Notes (Signed)
  Subjective:  Patient ID: Debbie Patel, female    DOB: Nov 26, 1985,  MRN: [id number]  Chief Complaint  Patient presents with  . Nail Problem    Pt is concerned she is unable to trim her nails properly and that they are not growing correctly.    34 y.o. female presents with the above complaint. History confirmed with patient.   Objective:  Physical Exam: warm, good capillary refill, nail exam distal hallux slight dystrophy, improved since prior, no trophic changes or ulcerative lesions, normal DP and PT pulses and normal sensory exam. Left Foot: no tenderness over the mid plantar fascia   Assessment:   1. Nail dystrophy    Plan:  Patient was evaluated and treated and all questions answered.  Onhychodystrophy -Nail improving. Gently trimmed back leading edge. -F/u PRN  No follow-ups on file.

## 2020-01-12 IMAGING — MG DIGITAL DIAGNOSTIC BILATERAL MAMMOGRAM WITH TOMO AND CAD
6 of 10 series · 6 of 30 positions shown · non-contrast
Comparison: None.

CLINICAL DATA: A 32-year-old patient recently had tenderness in the
outer periareolar left breast and a lump was palpated in this region
as well. Currently, she does not feel a lump in either breast.

EXAM:
DIGITAL DIAGNOSTIC BILATERAL MAMMOGRAM WITH CAD AND TOMO
ULTRASOUND LEFT BREAST

[L TAN synth-2D]
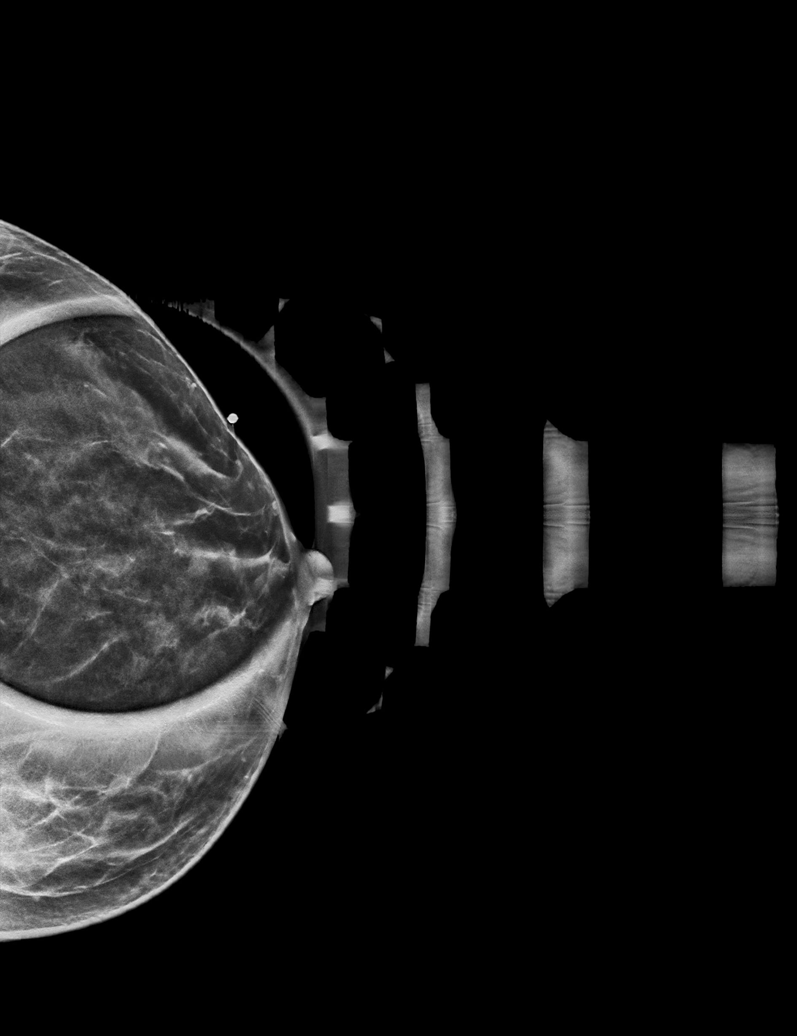

[L CC synth-2D]
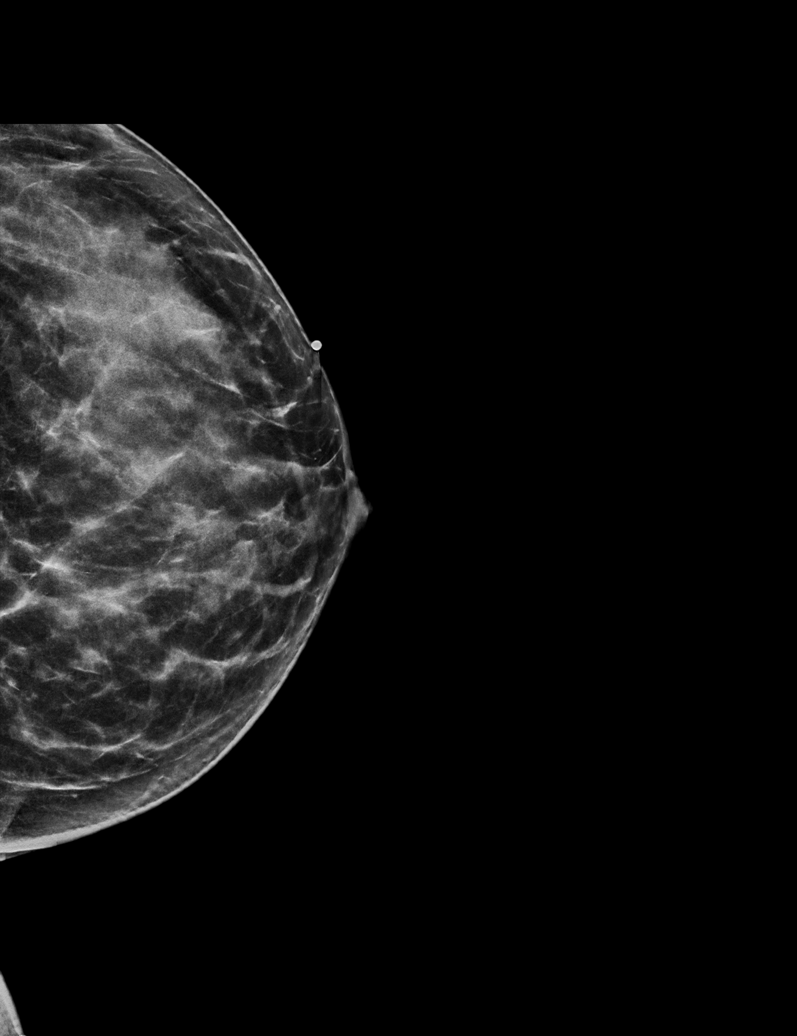

[R CC synth-2D]
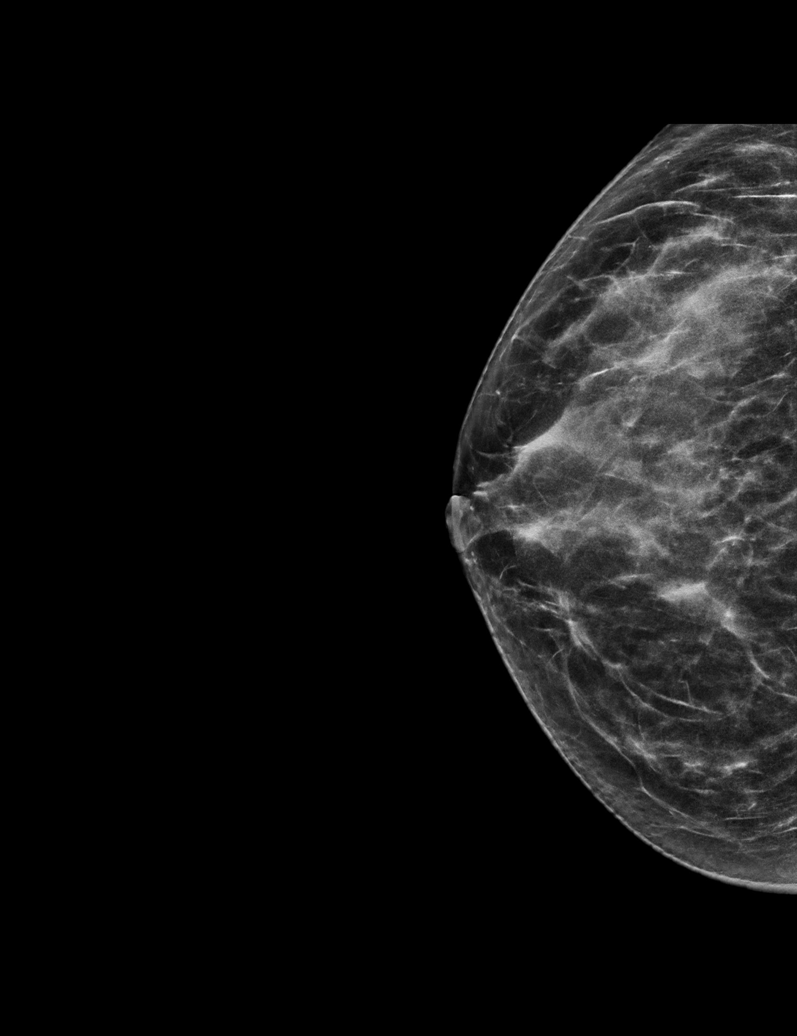

[R MLO synth-2D]
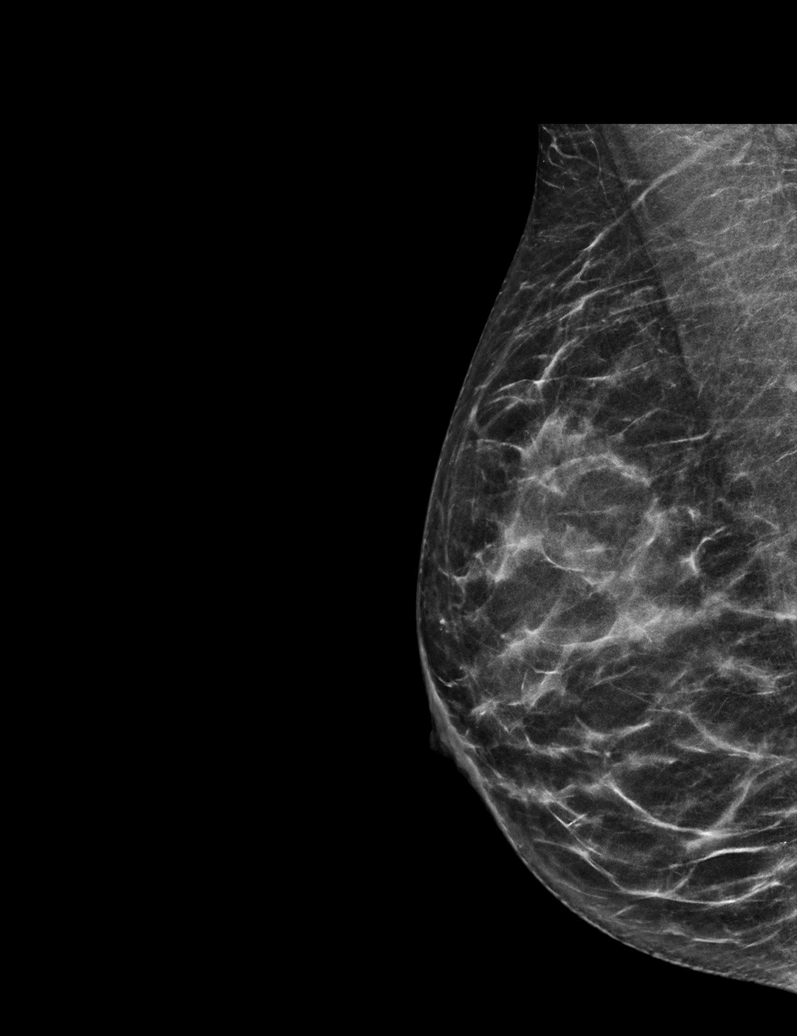

[L MLO synth-2D]
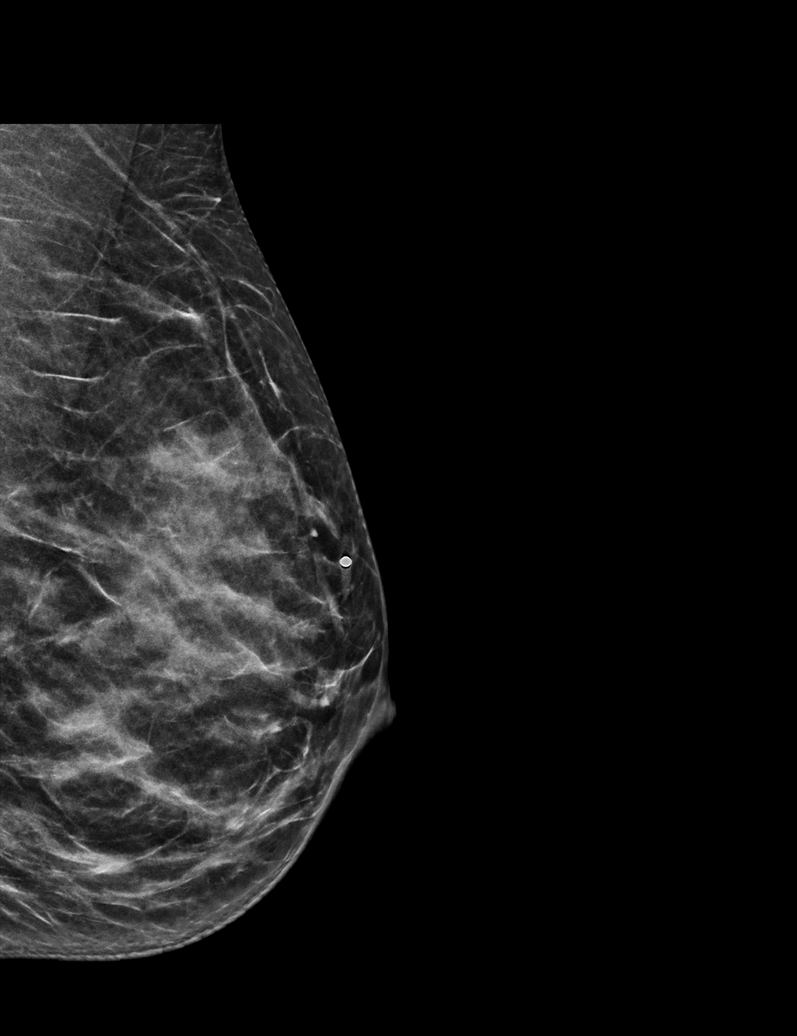

[R CC tomo · tomo slice 28/55.0]
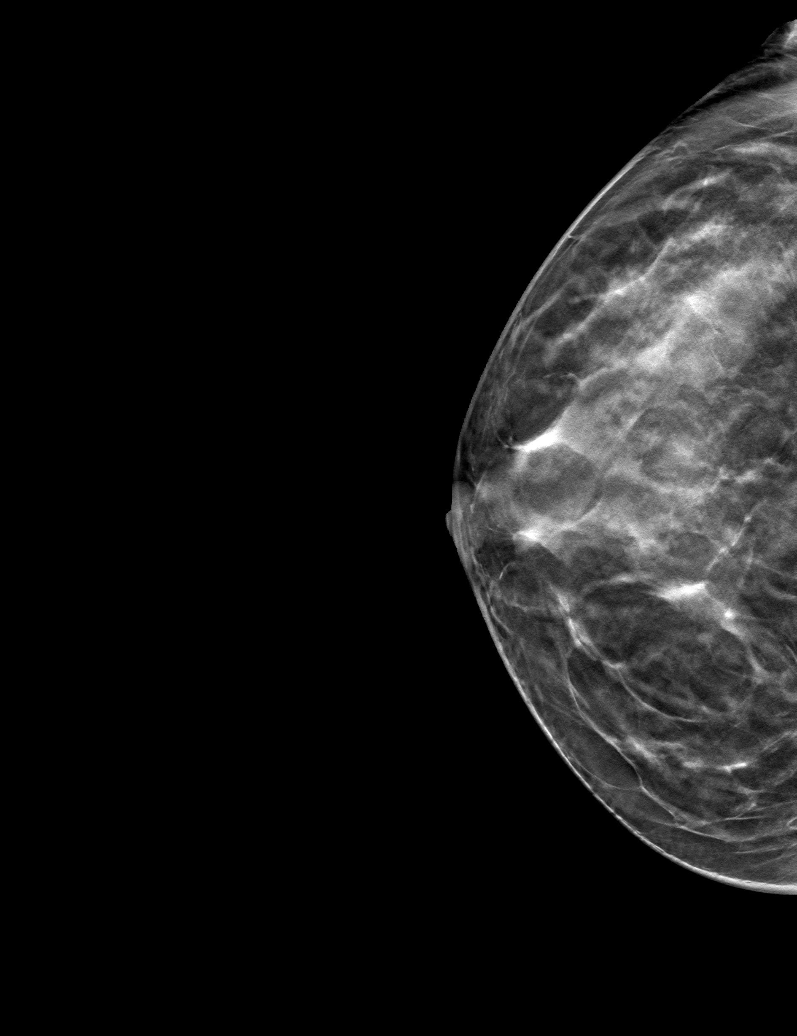

[6 of 30 positions shown; findings below may reference images not displayed]

Baseline exam.

ACR Breast Density Category c: The breast tissue is heterogeneously
dense, which may obscure small masses.
FINDINGS: No mass, architectural distortion, or suspicious microcalcification
is identified to suggest malignancy in either breast. Spot
tangential view of the outer left breast in the region of recent
pain and lump is negative.

Mammographic images were processed with CAD.

On physical exam, I do not palpate a lump in the upper outer
periareolar left breast. The skin appears normal.

Targeted ultrasound is performed, showing normal fibroglandular
tissue in the upper-outer left breast. No suspicious findings on
ultrasound.
IMPRESSION: No evidence of malignancy in either breast.

RECOMMENDATION:
Screening mammogram at age 38 unless there are persistent or
intervening clinical concerns. (Code:[id number])

I have discussed the findings and recommendations with the patient.
Results were also provided in writing at the conclusion of the
visit. If applicable, a reminder letter will be sent to the patient
regarding the next appointment.

BI-RADS CATEGORY  1: Negative.

## 2020-06-09 ENCOUNTER — Ambulatory Visit (INDEPENDENT_AMBULATORY_CARE_PROVIDER_SITE_OTHER): Payer: 59 | Admitting: Psychology

## 2020-06-09 DIAGNOSIS — F418 Other specified anxiety disorders: Secondary | ICD-10-CM

## 2020-06-25 ENCOUNTER — Ambulatory Visit (INDEPENDENT_AMBULATORY_CARE_PROVIDER_SITE_OTHER): Payer: 59 | Admitting: Psychology

## 2020-06-25 DIAGNOSIS — F418 Other specified anxiety disorders: Secondary | ICD-10-CM

## 2020-07-07 ENCOUNTER — Ambulatory Visit: Payer: 59 | Admitting: Psychology

## 2020-07-08 ENCOUNTER — Ambulatory Visit: Payer: 59 | Admitting: Psychology

## 2020-07-15 ENCOUNTER — Ambulatory Visit (INDEPENDENT_AMBULATORY_CARE_PROVIDER_SITE_OTHER): Payer: 59 | Admitting: Psychology

## 2020-07-15 DIAGNOSIS — F418 Other specified anxiety disorders: Secondary | ICD-10-CM

## 2020-07-30 ENCOUNTER — Ambulatory Visit (INDEPENDENT_AMBULATORY_CARE_PROVIDER_SITE_OTHER): Payer: 59 | Admitting: Psychology

## 2020-07-30 DIAGNOSIS — F418 Other specified anxiety disorders: Secondary | ICD-10-CM | POA: Diagnosis not present

## 2020-09-03 ENCOUNTER — Ambulatory Visit: Payer: 59 | Admitting: Psychology

## 2020-10-18 LAB — HM PAP SMEAR: HM Pap smear: NEGATIVE

## 2021-02-02 LAB — OB RESULTS CONSOLE RPR: RPR: NONREACTIVE

## 2021-02-02 LAB — OB RESULTS CONSOLE HEPATITIS B SURFACE ANTIGEN: Hepatitis B Surface Ag: NEGATIVE

## 2021-02-02 LAB — OB RESULTS CONSOLE RUBELLA ANTIBODY, IGM: Rubella: IMMUNE

## 2021-02-02 LAB — OB RESULTS CONSOLE HIV ANTIBODY (ROUTINE TESTING): HIV: NONREACTIVE

## 2021-02-02 LAB — OB RESULTS CONSOLE ABO/RH: RH Type: NEGATIVE

## 2021-02-02 LAB — OB RESULTS CONSOLE GC/CHLAMYDIA
Chlamydia: NEGATIVE
Gonorrhea: NEGATIVE

## 2021-02-02 LAB — OB RESULTS CONSOLE ANTIBODY SCREEN: Antibody Screen: NEGATIVE

## 2021-02-02 LAB — OB RESULTS CONSOLE VARICELLA ZOSTER ANTIBODY, IGG: Varicella: IMMUNE

## 2021-08-02 LAB — OB RESULTS CONSOLE GBS: GBS: NEGATIVE

## 2021-08-28 ENCOUNTER — Inpatient Hospital Stay (HOSPITAL_COMMUNITY): Admit: 2021-08-28 | Payer: Self-pay

## 2021-08-28 HISTORY — DX: Hypothyroidism, unspecified: E03.9

## 2021-08-29 NOTE — L&D Delivery Note (Signed)
° °  Delivery Note:   G2P1001 at [redacted]w[redacted]d  Admitting diagnosis: Encounter for induction of labor [Z34.90] Risks:  Hypothyroidism well controlled on synthroid 200 mcg Mon-Sat, 300 mcg on Sun. Followed by endocrinologist. Rh negative, spouse also confirmed Rh negative, no Rhogam indicated AMA 36 yo Late onset itching in palm, initiated work-up for ICP, but given term pregnancy patient was counseled for IOL and agreed.   First Stage:  Induction of labor:started with OF placement in office on 08/31/2020 Onset of labor: 1300 09/01/2020 Augmentation: AROM, Pitocin, Cytotec, and OP Foley ROM: 1958 09/01/2020 Active labor onset: 2000 09/01/2020 Analgesia /Anesthesia/Pain control intrapartum: Epidural   Second Stage:  Complete dilation at 09/01/2021  2312 Onset of pushing at 2315 FHR second stage Category 1   Pushing in recumbent position with CNM and L&D staff support, [name] present for birth and supportive. Nuchal Cord: No  Delivery of a Live born female  Birth Weight:  pending APGAR: 9, 9  Newborn Delivery   Birth date/time: 09/01/2021 23:31:00 Delivery type: Vaginal, Spontaneous      in cephalic presentation, position OA to LOT, compound L arm.  Cord double clamped after cessation of pulsation, cut by [name].  Collection of cord blood for typing completed. Cord blood donation-None  Arterial cord blood sample-No    Third Stage:  Placenta delivered-Spontaneous  with 3 vessels . Uterine tone firm, bleeding small Uterotonics: Pitocin IV bolus Placenta to L&D for disposal.  2nd degree laceration identified.  Episiotomy:None  Local analgesia: none  Repair:2.0 vicryl in standard fashion, good hemostasis and approximation Est. Blood Loss (mL):400 Complications: None   Mom to postpartum.  Baby [name] June to Couplet care / Skin to Skin.  Delivery Report:  Review the Delivery Report for details.     Signed: Neta Mends, CNM, MSN 09/02/2021, 12:08 AM

## 2021-08-31 ENCOUNTER — Encounter (HOSPITAL_COMMUNITY): Payer: Self-pay | Admitting: *Deleted

## 2021-09-01 ENCOUNTER — Encounter (HOSPITAL_COMMUNITY): Payer: Self-pay | Admitting: Obstetrics

## 2021-09-01 ENCOUNTER — Inpatient Hospital Stay (HOSPITAL_COMMUNITY): Payer: 59

## 2021-09-01 ENCOUNTER — Inpatient Hospital Stay (HOSPITAL_COMMUNITY)
Admission: AD | Admit: 2021-09-01 | Discharge: 2021-09-03 | DRG: 807 | Disposition: A | Discharge status: Home / Self Care | Payer: 59 | Attending: Obstetrics | Admitting: Obstetrics

## 2021-09-01 ENCOUNTER — Inpatient Hospital Stay (HOSPITAL_COMMUNITY): Payer: 59 | Admitting: Anesthesiology

## 2021-09-01 ENCOUNTER — Other Ambulatory Visit: Payer: Self-pay

## 2021-09-01 DIAGNOSIS — O26893 Other specified pregnancy related conditions, third trimester: Secondary | ICD-10-CM | POA: Diagnosis present

## 2021-09-01 DIAGNOSIS — O99284 Endocrine, nutritional and metabolic diseases complicating childbirth: Principal | ICD-10-CM | POA: Diagnosis present

## 2021-09-01 DIAGNOSIS — L299 Pruritus, unspecified: Secondary | ICD-10-CM | POA: Diagnosis present

## 2021-09-01 DIAGNOSIS — Z6791 Unspecified blood type, Rh negative: Secondary | ICD-10-CM | POA: Diagnosis not present

## 2021-09-01 DIAGNOSIS — Z3A40 40 weeks gestation of pregnancy: Secondary | ICD-10-CM | POA: Diagnosis not present

## 2021-09-01 DIAGNOSIS — Z20822 Contact with and (suspected) exposure to covid-19: Secondary | ICD-10-CM | POA: Diagnosis present

## 2021-09-01 DIAGNOSIS — Z3490 Encounter for supervision of normal pregnancy, unspecified, unspecified trimester: Secondary | ICD-10-CM | POA: Diagnosis present

## 2021-09-01 DIAGNOSIS — E039 Hypothyroidism, unspecified: Secondary | ICD-10-CM | POA: Diagnosis present

## 2021-09-01 DIAGNOSIS — O26899 Other specified pregnancy related conditions, unspecified trimester: Secondary | ICD-10-CM

## 2021-09-01 LAB — COMPREHENSIVE METABOLIC PANEL
ALT: 19 U/L (ref 0–44)
AST: 26 U/L (ref 15–41)
Albumin: 2.9 g/dL — ABNORMAL LOW (ref 3.5–5.0)
Alkaline Phosphatase: 127 U/L — ABNORMAL HIGH (ref 38–126)
Anion gap: 9 (ref 5–15)
BUN: 8 mg/dL (ref 6–20)
CO2: 19 mmol/L — ABNORMAL LOW (ref 22–32)
Calcium: 8.9 mg/dL (ref 8.9–10.3)
Chloride: 107 mmol/L (ref 98–111)
Creatinine, Ser: 0.63 mg/dL (ref 0.44–1.00)
GFR, Estimated: >60 mL/min (ref >60)
Glucose, Bld: 94 mg/dL (ref 70–99)
Potassium: 3.7 mmol/L (ref 3.5–5.1)
Sodium: 135 mmol/L (ref 135–145)
Total Bilirubin: 0.5 mg/dL (ref 0.3–1.2)
Total Protein: 6.6 g/dL (ref 6.5–8.1)

## 2021-09-01 LAB — TYPE AND SCREEN
ABO/RH(D): O NEG
Antibody Screen: NEGATIVE

## 2021-09-01 LAB — RESP PANEL BY RT-PCR (FLU A&B, COVID) ARPGX2
Influenza A by PCR: NEGATIVE
Influenza A by PCR: NEGATIVE
Influenza B by PCR: NEGATIVE
Influenza B by PCR: NEGATIVE
SARS Coronavirus 2 by RT PCR: POSITIVE — AB
SARS Coronavirus 2 by RT PCR: NEGATIVE

## 2021-09-01 LAB — CBC
HCT: 37 % (ref 36.0–46.0)
Hemoglobin: 13 g/dL (ref 12.0–15.0)
MCH: 32.3 pg (ref 26.0–34.0)
MCHC: 35.1 g/dL (ref 30.0–36.0)
MCV: 91.8 fL (ref 80.0–100.0)
Platelets: 202 10*3/uL (ref 150–400)
RBC: 4.03 MIL/uL (ref 3.87–5.11)
RDW: 13.2 % (ref 11.5–15.5)
WBC: 11.5 10*3/uL — ABNORMAL HIGH (ref 4.0–10.5)
nRBC: 0 % (ref 0.0–0.2)

## 2021-09-01 MED ORDER — OXYTOCIN-SODIUM CHLORIDE 30-0.9 UT/500ML-% IV SOLN
1.0000 m[IU]/min | INTRAVENOUS | Status: DC
  Administered 2021-09-01: 2 m[IU]/min via INTRAVENOUS
  Filled 2021-09-01: qty 500

## 2021-09-01 MED ORDER — LACTATED RINGERS IV SOLN: 500.0000 mL | Freq: Once | INTRAVENOUS | Status: DC

## 2021-09-01 MED ORDER — LACTATED RINGERS IV SOLN: INTRAVENOUS | Status: DC

## 2021-09-01 MED ORDER — LIDOCAINE HCL (PF) 1 % IJ SOLN: 30.0000 mL | INTRAMUSCULAR | Status: DC | PRN

## 2021-09-01 MED ORDER — DIPHENHYDRAMINE HCL 50 MG/ML IJ SOLN: 12.5000 mg | INTRAMUSCULAR | Status: DC | PRN

## 2021-09-01 MED ORDER — MISOPROSTOL 50MCG HALF TABLET
50.0000 ug | ORAL_TABLET | ORAL | Status: DC | PRN
  Administered 2021-09-01: 50 ug via BUCCAL
  Filled 2021-09-01: qty 1

## 2021-09-01 MED ORDER — TERBUTALINE SULFATE 1 MG/ML IJ SOLN: 0.2500 mg | Freq: Once | INTRAMUSCULAR | Status: DC | PRN

## 2021-09-01 MED ORDER — LACTATED RINGERS IV SOLN: 500.0000 mL | INTRAVENOUS | Status: DC | PRN

## 2021-09-01 MED ORDER — LEVOTHYROXINE SODIUM 100 MCG PO TABS
200.0000 ug | ORAL_TABLET | ORAL | Status: DC
  Administered 2021-09-02 – 2021-09-03 (×2): 200 ug via ORAL
  Filled 2021-09-01: qty 1
  Filled 2021-09-01: qty 2

## 2021-09-01 MED ORDER — FENTANYL-BUPIVACAINE-NACL 0.5-0.125-0.9 MG/250ML-% EP SOLN
12.0000 mL/h | EPIDURAL | Status: DC | PRN
  Administered 2021-09-01: 12 mL/h via EPIDURAL
  Filled 2021-09-01: qty 250

## 2021-09-01 MED ORDER — LIDOCAINE HCL (PF) 1 % IJ SOLN
INTRAMUSCULAR | Status: DC | PRN
  Administered 2021-09-01: 2 mL via EPIDURAL
  Administered 2021-09-01: 3 mL via EPIDURAL
  Administered 2021-09-01: 5 mL via EPIDURAL

## 2021-09-01 MED ORDER — EPHEDRINE 5 MG/ML INJ: 10.0000 mg | INTRAVENOUS | Status: DC | PRN

## 2021-09-01 MED ORDER — ONDANSETRON HCL 4 MG/2ML IJ SOLN
4.0000 mg | Freq: Four times a day (QID) | INTRAMUSCULAR | Status: DC | PRN
  Administered 2021-09-01: 4 mg via INTRAVENOUS
  Filled 2021-09-01: qty 2

## 2021-09-01 MED ORDER — OXYTOCIN-SODIUM CHLORIDE 30-0.9 UT/500ML-% IV SOLN: 2.5000 [IU]/h | INTRAVENOUS | Status: DC

## 2021-09-01 MED ORDER — SODIUM CHLORIDE 0.9% FLUSH: 3.0000 mL | Freq: Two times a day (BID) | INTRAVENOUS | Status: DC

## 2021-09-01 MED ORDER — OXYTOCIN 10 UNIT/ML IJ SOLN: 10.0000 [IU] | Freq: Once | INTRAMUSCULAR | Status: DC

## 2021-09-01 MED ORDER — PHENYLEPHRINE 40 MCG/ML (10ML) SYRINGE FOR IV PUSH (FOR BLOOD PRESSURE SUPPORT): 80.0000 ug | PREFILLED_SYRINGE | INTRAVENOUS | Status: DC | PRN

## 2021-09-01 MED ORDER — SODIUM CHLORIDE 0.9% FLUSH: 3.0000 mL | INTRAVENOUS | Status: DC | PRN

## 2021-09-01 MED ORDER — SOD CITRATE-CITRIC ACID 500-334 MG/5ML PO SOLN: 30.0000 mL | ORAL | Status: DC | PRN

## 2021-09-01 MED ORDER — OXYTOCIN BOLUS FROM INFUSION
333.0000 mL | Freq: Once | INTRAVENOUS | Status: AC
  Administered 2021-09-01: 333 mL via INTRAVENOUS

## 2021-09-01 MED ORDER — ACETAMINOPHEN 325 MG PO TABS: 650.0000 mg | ORAL_TABLET | ORAL | Status: DC | PRN

## 2021-09-01 MED ORDER — FENTANYL CITRATE (PF) 100 MCG/2ML IJ SOLN: 50.0000 ug | INTRAMUSCULAR | Status: DC | PRN

## 2021-09-01 MED ORDER — SODIUM CHLORIDE 0.9 % IV SOLN: 250.0000 mL | INTRAVENOUS | Status: DC | PRN

## 2021-09-01 MED ORDER — HYDROXYZINE HCL 50 MG PO TABS: 50.0000 mg | ORAL_TABLET | Freq: Four times a day (QID) | ORAL | Status: DC | PRN

## 2021-09-01 MED ORDER — LEVOTHYROXINE SODIUM 100 MCG PO TABS: 300.0000 ug | ORAL_TABLET | ORAL | Status: DC

## 2021-09-01 NOTE — H&P (Signed)
OB ADMISSION/ HISTORY & PHYSICAL:  Admission Date: 09/01/2021 11:38 AM  Admit Diagnosis: Encounter for induction of labor [Z34.90]    Debbie Patel is a 36 y.o. female presenting for IOL at term. She had a cervical balloon placed in the office yesterday, still in place when arrived on L&D. Removed by RN from vaginal canal. Reports normal FM, no LOF/VB, no ctx at present.   Patient reports cramping yesterday afternoon, then quieted for the evening, was able to sleep overnight.  She noted itching in her palms over the weekend and came in for evaluation yesterday, bile salts pending, NST was reactive, and patient consented to IOL at term.  Spouse Debbie Patel present and supportive. Expecting baby girl Debbie Patel. Plans epidural for labor.   Prenatal History: G2P1001   EDC : 08/28/2021, by [facility]  Prenatal care at [facility] since [redacted] wks GA, primary D. Debbie Patel, CNM   Prenatal course complicated by: Hypothyroidism well controlled on synthroid 200 mcg Mon-Sat, 300 mcg on Sun. Followed by endocrinologist. Rh negative, spouse also confirmed Rh negative, no Rhogam indicated AMA 36 yo Late onset itching in palm, initiated work-up for ICP, but given term pregnancy patient was counseled for IOL and agreed.   Prenatal Labs: ABO, Rh: O (06/07 0000)  Antibody: Negative (06/07 0000) Rubella: Immune (06/07 0000)  RPR: Nonreactive (06/07 0000)  HBsAg: Negative (06/07 0000)  HIV: Non-reactive (06/07 0000)  GBS: Negative/-- (12/05 0000)  1 hr Glucola : 83 Genetic Screening: NIPT LR XX, NT scan normal, AFP1 negative Ultrasound: CUS normal female anatomy, AGA, posterior previa placenta F/U sono 28 wks showed AGA 49%, LLP anterior, then 34 wks placenta well away from cervix  Vaccines: TDaP          UTD         Flu             UTD                    COVID-19 UTD  Maternal Diabetes: No Genetic Screening: Normal Maternal Ultrasounds/Referrals: Normal Fetal Ultrasounds or other  Referrals:  None Maternal Substance Abuse:  No Significant Maternal Medications:  Meds include: Syntroid Significant Maternal Lab Results:  Group B Strep negative Other Comments:  None  Medical / Surgical History :  Past medical history:  Past Medical History:  Diagnosis Date   Eustachian tube dysfunction    Hashimoto's thyroiditis    Hypothyroidism    Nontoxic multinodular goiter      Past surgical history:  Past Surgical History:  Procedure Laterality Date   CHOLECYSTECTOMY     CYSTECTOMY Left    left wrist   THYROIDECTOMY  08/20/2012   WISDOM TOOTH EXTRACTION       Family History:  Family History  Problem Relation Age of Onset   Hypertension Father    CVA Maternal Grandmother    Hypothyroidism Maternal Grandmother    Alzheimer's disease Maternal Grandfather    Alzheimer's disease Paternal Grandmother    CAD Paternal Grandfather    CVA Paternal Grandfather      Social History:  reports that she has never smoked. She has never used smokeless tobacco. She reports that she does not currently use alcohol. She reports that she does not use drugs.  Allergies: Nickel and Other   Current Medications at time of admission:  Medications Prior to Admission  Medication Sig Dispense Refill Last Dose   Fexofenadine HCl (ALLEGRA PO) Take 1 capsule by mouth  daily.      levothyroxine (SYNTHROID, LEVOTHROID) 175 MCG tablet Take 150 mcg by mouth daily before breakfast.       Multiple Vitamin (MULTI-VITAMIN) tablet Take by mouth.        Review of Systems: ROS As noted above Physical Exam: Vital signs and nursing notes reviewed.  Patient Vitals for the past 24 hrs:  BP Temp Temp src Pulse Resp Height Weight  09/01/21 1202 129/81 98.5 F (36.9 C) Oral (!) 104 16 5\' 11"  (1.803 m) 102.3 kg     General: AAO x 3, NAD, coping well Heart: RRR Lungs:CTAB Abdomen: Gravid, NT, Leopold's vertex Extremities: no edema Genitalia / VE: Dilation: 5 Effacement (%): Thick Cervical  Position: Posterior Station: -2 Presentation: Vertex Exam by:: Derrell Lolling CNM   FHR: 150 BPM, moderate variability, small accels, no decels TOCO: Ctx none  Labs:   Pending T&S, CBC, RPR, CMP No results for input(s): WBC, HGB, HCT, PLT in the last 72 hours.   Assessment/Plan:  36 y.o. G2P1001 at [redacted]w[redacted]d Hypothyroidism well controlled  - continue synthroid 200 mcg  daily Mon-Sat, 300 mcg Sunday Pruritus  - bile salt pending  - CMP pending  Fetal wellbeing - FHT category 1 EFW 6.5-7 lbs, AGA  Labor: IOL at term, s/p cervical balloon outpatient Good response with OF, continue ripening with buccal Cytotec, plan Pitocin/AROM when favorable  GBS neg Rubella imm Rh neg, no Rhogam in pregnancy/partner Rh negative confirmed  Pain control: desires epidural in active labor Analgesia/anesthesia PRN  Anticipated MOD: NSVB, pelvis proven 7'4"  Plans to breastfeed POC discussed with patient and support team, all questions answered.  Dr Debbie Patel notified of admission / plan of care   [name], MSN 09/01/2021, 12:59 PM

## 2021-09-01 NOTE — Progress Notes (Signed)
Blaklee Shores is a 36 y.o. G2P1001 at [redacted]w[redacted]d by ultrasound admitted for  IOL at term. Pruritus new onset, ICP work-up in progress  Subjective: Feeling some mild cramping, spouse present and supportive.   Objective: Vitals:   09/01/21 1202 09/01/21 1304 09/01/21 1422 09/01/21 1524  BP: 129/81 118/77 125/79 124/73  Pulse: (!) 104 89 81 76  Resp: 16     Temp: 98.5 F (36.9 C)     TempSrc: Oral     Weight: 102.3 kg     Height: 5\' 11"  (1.803 m)       No intake/output data recorded. No intake/output data recorded.   FHT:  FHR: 145 bpm, variability: moderate,  accelerations:  Present,  decelerations:  Absent UC:   regular, every 4-5 minutes SVE:   Dilation: 6 Effacement (%): 60 Station: -1 Exam by:: [email or web address] CNM Membrane sweep, BBOW, posterior cervix  Labs:   Recent Labs    09/01/21 1304  WBC 11.5*  HGB 13.0  HCT 37.0  PLT 202  CMP     Component Value Date/Time   NA 135 09/01/2021 1304   K 3.7 09/01/2021 1304   CL 107 09/01/2021 1304   CO2 19 (L) 09/01/2021 1304   GLUCOSE 94 09/01/2021 1304   BUN 8 09/01/2021 1304   CREATININE 0.63 09/01/2021 1304   CALCIUM 8.9 09/01/2021 1304   PROT 6.6 09/01/2021 1304   ALBUMIN 2.9 (L) 09/01/2021 1304   AST 26 09/01/2021 1304   ALT 19 09/01/2021 1304   ALKPHOS 127 (H) 09/01/2021 1304   BILITOT 0.5 09/01/2021 1304   GFRNONAA >60 09/01/2021 1304     Assessment / Plan:  G2P1001 36 y.o. [redacted]w[redacted]d IOL  at term, progressing well on cytotec Hypothyroidism stable on synthroid Pruritus, bile salts pending, LFT's wnl  Labor:  start Pitocin at 4 hrs from cytotec dose, AROM when comfortable with epidural Preeclampsia:  no signs or symptoms of toxicity Fetal Wellbeing:  Category I Pain Control:  Epidural at patient request I/D:   GBS neg Anticipated MOD:  NSVD  Neta Mends, CNM, MSN 09/01/2021, 4:07 PM

## 2021-09-01 NOTE — Anesthesia Preprocedure Evaluation (Signed)
Anesthesia Evaluation  Patient identified by MRN, date of birth, ID band Patient awake    Reviewed: Allergy & Precautions, NPO status , Patient's Chart, lab work & pertinent test results  Airway Mallampati: II  TM Distance: >3 FB Neck ROM: Full    Dental  (+) Teeth Intact, Dental Advisory Given   Pulmonary neg pulmonary ROS,  COVID +, no sxs   Pulmonary exam normal breath sounds clear to auscultation       Cardiovascular negative cardio ROS Normal cardiovascular exam Rhythm:Regular Rate:Normal     Neuro/Psych negative neurological ROS     GI/Hepatic negative GI ROS, Neg liver ROS,   Endo/Other  Hypothyroidism   Renal/GU negative Renal ROS     Musculoskeletal negative musculoskeletal ROS (+)   Abdominal   Peds  Hematology Plt 202k   Anesthesia Other Findings Day of surgery medications reviewed with the patient.  Reproductive/Obstetrics (+) Pregnancy                             Anesthesia Physical Anesthesia Plan  ASA: 2  Anesthesia Plan: Epidural   Post-op Pain Management:    Induction:   PONV Risk Score and Plan: 2 and Treatment may vary due to age or medical condition  Airway Management Planned: Natural Airway  Additional Equipment:   Intra-op Plan:   Post-operative Plan:   Informed Consent: I have reviewed the patients History and Physical, chart, labs and discussed the procedure including the risks, benefits and alternatives for the proposed anesthesia with the patient or authorized representative who has indicated his/her understanding and acceptance.     Dental advisory given  Plan Discussed with:   Anesthesia Plan Comments: (Patient identified. Risks/Benefits/Options discussed with patient including but not limited to bleeding, infection, nerve damage, paralysis, failed block, incomplete pain control, headache, blood pressure changes, nausea, vomiting, reactions  to medication both or allergic, itching and postpartum back pain. Confirmed with bedside nurse the patient's most recent platelet count. Confirmed with patient that they are not currently taking any anticoagulation, have any bleeding history or any family history of bleeding disorders. Patient expressed understanding and wished to proceed. All questions were answered. )        Anesthesia Quick Evaluation

## 2021-09-01 NOTE — Anesthesia Procedure Notes (Signed)
Epidural Patient location during procedure: OB Start time: 09/01/2021 7:18 PM End time: 09/01/2021 7:25 PM  Staffing Anesthesiologist: Cecile Hearing, MD Performed: anesthesiologist   Preanesthetic Checklist Completed: patient identified, IV checked, risks and benefits discussed, monitors and equipment checked, pre-op evaluation and timeout performed  Epidural Patient position: sitting Prep: DuraPrep Patient monitoring: blood pressure and continuous pulse ox Approach: midline Location: L3-L4 Injection technique: LOR air  Needle:  Needle type: Tuohy  Needle gauge: 17 G Needle length: 9 cm Needle insertion depth: 5 cm Catheter size: 19 Gauge Catheter at skin depth: 10 cm Test dose: negative and Other (1% Lidocaine)  Additional Notes Patient identified.  Risk benefits discussed including failed block, incomplete pain control, headache, nerve damage, paralysis, blood pressure changes, nausea, vomiting, reactions to medication both toxic or allergic, and postpartum back pain.  Patient expressed understanding and wished to proceed.  All questions were answered.  Sterile technique used throughout procedure and epidural site dressed with sterile barrier dressing. No paresthesia or other complications noted. The patient did not experience any signs of intravascular injection such as tinnitus or metallic taste in mouth nor signs of intrathecal spread such as rapid motor block. Please see nursing notes for vital signs. Reason for block:procedure for pain

## 2021-09-01 NOTE — Progress Notes (Signed)
OP foley removed easily by RN upon initial assessment

## 2021-09-02 ENCOUNTER — Encounter (HOSPITAL_COMMUNITY): Payer: Self-pay | Admitting: Obstetrics

## 2021-09-02 DIAGNOSIS — Z6791 Unspecified blood type, Rh negative: Secondary | ICD-10-CM

## 2021-09-02 LAB — CBC
HCT: 34.1 % — ABNORMAL LOW (ref 36.0–46.0)
Hemoglobin: 11.6 g/dL — ABNORMAL LOW (ref 12.0–15.0)
MCH: 31.7 pg (ref 26.0–34.0)
MCHC: 34 g/dL (ref 30.0–36.0)
MCV: 93.2 fL (ref 80.0–100.0)
Platelets: 174 10*3/uL (ref 150–400)
RBC: 3.66 MIL/uL — ABNORMAL LOW (ref 3.87–5.11)
RDW: 13 % (ref 11.5–15.5)
WBC: 16.8 10*3/uL — ABNORMAL HIGH (ref 4.0–10.5)
nRBC: 0 % (ref 0.0–0.2)

## 2021-09-02 LAB — RPR: RPR Ser Ql: NONREACTIVE

## 2021-09-02 MED ORDER — WITCH HAZEL-GLYCERIN EX PADS: 1.0000 "application " | MEDICATED_PAD | CUTANEOUS | Status: DC | PRN

## 2021-09-02 MED ORDER — ZOLPIDEM TARTRATE 5 MG PO TABS: 5.0000 mg | ORAL_TABLET | Freq: Every evening | ORAL | Status: DC | PRN

## 2021-09-02 MED ORDER — OXYCODONE HCL 5 MG PO TABS: 5.0000 mg | ORAL_TABLET | ORAL | Status: DC | PRN

## 2021-09-02 MED ORDER — SENNOSIDES-DOCUSATE SODIUM 8.6-50 MG PO TABS
2.0000 | ORAL_TABLET | ORAL | Status: DC
  Administered 2021-09-02 – 2021-09-03 (×2): 2 via ORAL
  Filled 2021-09-02 (×2): qty 2

## 2021-09-02 MED ORDER — BENZOCAINE-MENTHOL 20-0.5 % EX AERO
1.0000 "application " | INHALATION_SPRAY | CUTANEOUS | Status: DC | PRN
  Administered 2021-09-02: 1 via TOPICAL
  Filled 2021-09-02 (×2): qty 56

## 2021-09-02 MED ORDER — METHYLERGONOVINE MALEATE 0.2 MG PO TABS: 0.2000 mg | ORAL_TABLET | ORAL | Status: DC | PRN

## 2021-09-02 MED ORDER — ONDANSETRON HCL 4 MG PO TABS: 4.0000 mg | ORAL_TABLET | ORAL | Status: DC | PRN

## 2021-09-02 MED ORDER — PANTOPRAZOLE SODIUM 40 MG PO TBEC
40.0000 mg | DELAYED_RELEASE_TABLET | Freq: Every evening | ORAL | Status: DC
  Filled 2021-09-02: qty 1

## 2021-09-02 MED ORDER — DIBUCAINE (PERIANAL) 1 % EX OINT: 1.0000 "application " | TOPICAL_OINTMENT | CUTANEOUS | Status: DC | PRN

## 2021-09-02 MED ORDER — METHYLERGONOVINE MALEATE 0.2 MG/ML IJ SOLN: 0.2000 mg | INTRAMUSCULAR | Status: DC | PRN

## 2021-09-02 MED ORDER — ACETAMINOPHEN 500 MG PO TABS
1000.0000 mg | ORAL_TABLET | Freq: Four times a day (QID) | ORAL | Status: DC
  Administered 2021-09-02 – 2021-09-03 (×6): 1000 mg via ORAL
  Filled 2021-09-02 (×6): qty 2

## 2021-09-02 MED ORDER — BISACODYL 10 MG RE SUPP: 10.0000 mg | Freq: Every day | RECTAL | Status: DC | PRN

## 2021-09-02 MED ORDER — RISAQUAD PO CAPS
1.0000 | ORAL_CAPSULE | Freq: Every day | ORAL | Status: DC
  Filled 2021-09-02: qty 1

## 2021-09-02 MED ORDER — PROBIOTIC-10 ULTIMATE PO CAPS: 1.0000 | ORAL_CAPSULE | Freq: Every day | ORAL | Status: DC

## 2021-09-02 MED ORDER — ONDANSETRON HCL 4 MG/2ML IJ SOLN: 4.0000 mg | INTRAMUSCULAR | Status: DC | PRN

## 2021-09-02 MED ORDER — IBUPROFEN 600 MG PO TABS
600.0000 mg | ORAL_TABLET | Freq: Four times a day (QID) | ORAL | Status: DC
  Administered 2021-09-02 – 2021-09-03 (×6): 600 mg via ORAL
  Filled 2021-09-02 (×6): qty 1

## 2021-09-02 MED ORDER — COCONUT OIL OIL
1.0000 "application " | TOPICAL_OIL | Status: DC | PRN
  Administered 2021-09-02: 1 via TOPICAL

## 2021-09-02 MED ORDER — DIPHENHYDRAMINE HCL 25 MG PO CAPS: 25.0000 mg | ORAL_CAPSULE | Freq: Four times a day (QID) | ORAL | Status: DC | PRN

## 2021-09-02 MED ORDER — TETANUS-DIPHTH-ACELL PERTUSSIS 5-2.5-18.5 LF-MCG/0.5 IM SUSY: 0.5000 mL | PREFILLED_SYRINGE | Freq: Once | INTRAMUSCULAR | Status: DC

## 2021-09-02 MED ORDER — PRENATAL MULTIVITAMIN CH
1.0000 | ORAL_TABLET | Freq: Every day | ORAL | Status: DC
  Administered 2021-09-02 – 2021-09-03 (×2): 1 via ORAL
  Filled 2021-09-02 (×2): qty 1

## 2021-09-02 MED ORDER — SIMETHICONE 80 MG PO CHEW: 80.0000 mg | CHEWABLE_TABLET | ORAL | Status: DC | PRN

## 2021-09-02 MED ORDER — FLEET ENEMA 7-19 GM/118ML RE ENEM: 1.0000 | ENEMA | Freq: Every day | RECTAL | Status: DC | PRN

## 2021-09-02 NOTE — Anesthesia Postprocedure Evaluation (Signed)
Anesthesia Post Note  Patient: Jacquely Kadir  Procedure(s) Performed: AN AD HOC LABOR EPIDURAL     Patient location during evaluation: Mother Baby Anesthesia Type: Epidural Level of consciousness: awake and alert and oriented Pain management: satisfactory to patient Vital Signs Assessment: post-procedure vital signs reviewed and stable Respiratory status: respiratory function stable Cardiovascular status: stable Postop Assessment: no headache, no backache, epidural receding, patient able to bend at knees, no signs of nausea or vomiting, adequate PO intake and able to ambulate Anesthetic complications: no   No notable events documented.  Last Vitals:  Vitals:   09/02/21 0500 09/02/21 0609  BP:  102/72  Pulse:  65  Resp: (!) 1 16  Temp:    SpO2:  98%    Last Pain:  Vitals:   09/02/21 0715  TempSrc:   PainSc: Asleep   Pain Goal:                   Aleksa Collinsworth

## 2021-09-02 NOTE — Lactation Note (Signed)
This note was copied from a baby's chart. Lactation Consultation Note  Patient Name: Debbie Patel [id number] Date: 09/02/2021   Age:38 hours Per RN  L&D [name]), infant latched already in L&D and mom would like to be seen later on MBU.  Maternal Data    Feeding    LATCH Score Latch: Repeated attempts needed to sustain latch, nipple held in mouth throughout feeding, stimulation needed to elicit sucking reflex.  Audible Swallowing: None  Type of Nipple: Everted at rest and after stimulation  Comfort (Breast/Nipple): Filling, red/small blisters or bruises, mild/mod discomfort  Hold (Positioning): No assistance needed to correctly position infant at breast.  LATCH Score: 6   Lactation Tools Discussed/Used    Interventions    Discharge    Consult Status      [name] 09/02/2021, 1:12 AM

## 2021-09-02 NOTE — Progress Notes (Signed)
° ° °  PPD # 1 S/P NSVD  Live born female  Birth Weight: 8 lb 5 oz (3771 g) APGAR: 9, 9  Newborn Delivery   Birth date/time: 09/01/2021 23:31:00 Delivery type: Vaginal, Spontaneous     Baby name: Lissa Merlin June Delivering provider: Derrell Lolling C  Episiotomy:None   Lacerations:2nd degree;Perineal   Feeding: breast  Pain control at delivery: Epidural   S:  Reports feeling well.             Tolerating po/ No nausea or vomiting             Bleeding is light             Pain controlled with acetaminophen and ibuprofen (OTC)             Up ad lib / ambulatory / voiding without difficulties   O:  A & O x 3, in no apparent distress              VS:  Vitals:   09/02/21 0132 09/02/21 0233 09/02/21 0500 09/02/21 0609  BP: 115/69 108/67  102/72  Pulse: 75 66  65  Resp: 17 17 (!) 1 16  Temp: 99.2 F (37.3 C) 98 F (36.7 C)    TempSrc: Oral Oral  Oral  SpO2: 100% 97%  98%  Weight:      Height:        LABS:  Recent Labs    09/01/21 1304 09/02/21 0636  WBC 11.5* 16.8*  HGB 13.0 11.6*  HCT 37.0 34.1*  PLT 202 174    Blood type: --/--/O NEG (01/04 1304)  Rubella: Immune (06/07 0000)   I&O: I/O last 3 completed shifts: In: -  Out: 500 [Stool:100; Blood:400]          No intake/output data recorded.  Vaccines: TDaP          UTD         Flu             UTD                    COVID-19 UTD  Gen: AAO x 3, NAD  Abdomen: soft, non-tender, non-distended             Fundus: firm, non-tender, U-1  Perineum: repair intact, mild edema  Lochia: small  Extremities: no edema, no calf pain or tenderness    A/P: PPD # 1 36 y.o., [id number]   Principal Problem:   Postpartum care following vaginal delivery 1/4 Active Problems:   Hypothyroidism on Synthroid as established  - f/u PP with established endocrinologist   Encounter for induction of labor   SVD (spontaneous vaginal delivery)   Perineal laceration, second degree   Rh negative, maternal / newborn Rh negative   Doing well -  stable status  Routine post partum orders  Anticipate discharge tomorrow    Juliene Pina, MSN, CNM 09/02/2021, 8:01 AM

## 2021-09-02 NOTE — Lactation Note (Signed)
This note was copied from a baby's chart. Lactation Consultation Note  Patient Name: Debbie Patel [id number] Date: 09/02/2021 Reason for consult: Initial assessment;Term Age:38 hours   P2 mother whose infant is now 59 hours old.  This is a term baby at 40+4 weeks.  Mother breast fed her first child (now 42 years old) for 5 months.  Her current feeding preference is breast.  Baby "[name]" was attempting to latch; restless at the breast.  Offered to assist.  Mother reported feeding for 30 minutes prior to my arrival.  She was using the cradle hold and baby was not latching when I arrived.  Suggested the cross cradle; "[name]" still restless.  Burped and latched in the football hold.  She continued to suck on/off for 10 minutes.  Swaddled and placed in bassinet.  Mother appreciative.  Encouraged to feed on cue.  Breast feeding basics reviewed.  Mother will call as needed for latch assistance.  Father present.   Maternal Data Has patient been taught Hand Expression?: Yes Does the patient have breastfeeding experience prior to this delivery?: Yes How long did the patient breastfeed?: 5 months with her first child  Feeding Mother's Current Feeding Choice: Breast Milk  LATCH Score Latch: Repeated attempts needed to sustain latch, nipple held in mouth throughout feeding, stimulation needed to elicit sucking reflex.  Audible Swallowing: None  Type of Nipple: Everted at rest and after stimulation  Comfort (Breast/Nipple): Soft / non-tender  Hold (Positioning): Assistance needed to correctly position infant at breast and maintain latch.  LATCH Score: 6   Lactation Tools Discussed/Used    Interventions Interventions: Breast feeding basics reviewed;Assisted with latch;Skin to skin;Breast massage;Hand express;Adjust position;Breast compression;Coconut oil;Position options;Support pillows;Education;LC Services brochure  Discharge Pump: Personal Sales executive) WIC Program: No  Consult  Status Consult Status: Follow-up Date: 09/03/21 Follow-up type: In-patient    Maleeka Sabatino R Beatrice Sehgal 09/02/2021, 4:48 AM

## 2021-09-02 NOTE — Plan of Care (Signed)
Problem: Education: Goal: Knowledge of Childbirth will improve 09/02/2021 0142 by Suzanne Boron, RN Outcome: Completed/Met 09/01/2021 1940 by Suzanne Boron, RN Outcome: Progressing Goal: Ability to make informed decisions regarding treatment and plan of care will improve 09/02/2021 0142 by Monseratt Ledin, Peri Jefferson, RN Outcome: Completed/Met 09/01/2021 1940 by Suzanne Boron, RN Outcome: Progressing Goal: Ability to state and carry out methods to decrease the pain will improve 09/02/2021 0142 by Suzanne Boron, RN Outcome: Completed/Met 09/01/2021 1940 by Suzanne Boron, RN Outcome: Progressing Goal: Individualized Educational Video(s) 09/02/2021 0142 by Suzanne Boron, RN Outcome: Completed/Met 09/01/2021 1940 by Suzanne Boron, RN Outcome: Progressing   Problem: Coping: Goal: Ability to verbalize concerns and feelings about labor and delivery will improve 09/02/2021 0142 by Arnold Depinto, Peri Jefferson, RN Outcome: Completed/Met 09/01/2021 1940 by Suzanne Boron, RN Outcome: Progressing   Problem: Life Cycle: Goal: Ability to make normal progression through stages of labor will improve 09/02/2021 0142 by Suzanne Boron, RN Outcome: Completed/Met 09/01/2021 1940 by Suzanne Boron, RN Outcome: Progressing Goal: Ability to effectively push during vaginal delivery will improve 09/02/2021 0142 by Suzanne Boron, RN Outcome: Completed/Met 09/01/2021 1940 by Suzanne Boron, RN Outcome: Progressing   Problem: Role Relationship: Goal: Will demonstrate positive interactions with the child 09/02/2021 0142 by Suzanne Boron, RN Outcome: Completed/Met 09/01/2021 1940 by Suzanne Boron, RN Outcome: Progressing   Problem: Safety: Goal: Risk of complications during labor and delivery will decrease 09/02/2021 0142 by Suzanne Boron, RN Outcome: Completed/Met 09/01/2021 1940 by Suzanne Boron, RN Outcome: Progressing   Problem: Pain Management: Goal: Relief or control of pain from uterine contractions will  improve 09/02/2021 0142 by Suzanne Boron, RN Outcome: Completed/Met 09/01/2021 1940 by Suzanne Boron, RN Outcome: Progressing

## 2021-09-03 MED ORDER — ACETAMINOPHEN 500 MG PO TABS: 1000.0000 mg | ORAL_TABLET | Freq: Four times a day (QID) | ORAL | 0 refills | Status: AC

## 2021-09-03 MED ORDER — COCONUT OIL OIL: 1.0000 "application " | TOPICAL_OIL | 0 refills | Status: DC | PRN

## 2021-09-03 MED ORDER — BENZOCAINE-MENTHOL 20-0.5 % EX AERO: 1.0000 "application " | INHALATION_SPRAY | CUTANEOUS | Status: DC | PRN

## 2021-09-03 MED ORDER — IBUPROFEN 600 MG PO TABS: 600.0000 mg | ORAL_TABLET | Freq: Four times a day (QID) | ORAL | 0 refills | Status: AC

## 2021-09-03 NOTE — Discharge Instructions (Signed)
Lactation outpatient support - home visit ° ° °Jessica Bowers, IBCLC (lactation consultant)  & Birth Doula ° °Phone (text or call): [phone] °Email: [email or web address] °[email or web address] ° ° °Linda Coppola °RN, MHA, IBCLC °at [facility]: Lactation Consultant ° °[email or web address] °Mail: [email or web address] °Tel: [phone] ° ° °Additional breastfeeding resources: ° °[facility] °https://ibconline.ca/information-sheets/ ° °[employer] [facility] [place] ° °www.lllofnc.org ° ° °Other Resources: ° °Chiropractic specialist  ° °Dr. Leanna Hastings °[email or web address] ° ° °Craniosacral therapy for baby ° °Erin Balkind  °[email or web address]://[email or web address]/ ° °

## 2021-09-03 NOTE — Discharge Summary (Signed)
OB Discharge Summary  Patient Name: Debbie Patel DOB: [birth year] MRN: [id number]  Date of admission: 09/01/2021 Delivering provider: Neta Mends   Admitting diagnosis: Encounter for induction of labor [Z34.90] Intrauterine pregnancy: [redacted]w[redacted]d     Secondary diagnosis: Patient Active Problem List   Diagnosis Date Noted   SVD (spontaneous vaginal delivery) 09/02/2021   Perineal laceration, second degree 09/02/2021   Rh negative, maternal / newborn Rh negative 09/02/2021   Encounter for induction of labor 09/01/2021   History of Hashimoto thyroiditis 10/31/2019   S/P cholecystectomy 04/28/2019   Hypothyroidism 12/03/2016   Postpartum care following vaginal delivery 1/4 12/01/2016   Fracture of left foot 10/17/2012   Additional problems:none   Date of discharge: 09/03/2021   Discharge diagnosis: Principal Problem:   Postpartum care following vaginal delivery 1/4 Active Problems:   Hypothyroidism   Encounter for induction of labor   SVD (spontaneous vaginal delivery)   Perineal laceration, second degree   Rh negative, maternal / newborn Rh negative                                                              Post partum procedures: none  Augmentation: AROM, Pitocin, Cytotec, and OP Foley Pain control: Epidural  Laceration:2nd degree;Perineal  Episiotomy:None  Complications: None  Hospital course:  Induction of Labor With Vaginal Delivery   36 y.o. yo [id number] at [redacted]w[redacted]d was admitted to the hospital 09/01/2021 for induction of labor.  Indication for induction: Elective.  Patient had an uncomplicated labor course as follows: Membrane Rupture Time/Date: 7:58 PM ,09/01/2021   Delivery Method:Vaginal, Spontaneous  Episiotomy: None  Lacerations:  2nd degree;Perineal  Details of delivery can be found in separate delivery note.  Patient had a routine postpartum course. Patient is discharged home 09/03/21.  Newborn Data: Birth date:09/01/2021  Birth time:11:31 PM  Gender:Female   Living status:Living  Apgars:9 ,9  Weight:3771 g   Physical exam  Vitals:   09/02/21 0609 09/02/21 1100 09/02/21 2038 09/03/21 0538  BP: 102/72 107/65 115/75 99/60  Pulse: 65 63 65 (!) 52  Resp: 16 16 16 18   Temp:   97.8 F (36.6 C) 97.8 F (36.6 C)  TempSrc: Oral  Oral Oral  SpO2: 98% 99% 96% 99%  Weight:      Height:       General: alert, cooperative, and no distress Lochia: appropriate Uterine Fundus: firm Incision: N/A Perineum: repair intact, no edema DVT Evaluation: No cords or calf tenderness. No significant calf/ankle edema. Labs: Lab Results  Component Value Date   WBC 16.8 (H) 09/02/2021   HGB 11.6 (L) 09/02/2021   HCT 34.1 (L) 09/02/2021   MCV 93.2 09/02/2021   PLT 174 09/02/2021   CMP Latest Ref Rng & Units 09/01/2021  Glucose 70 - 99 mg/dL 94  BUN 6 - 20 mg/dL 8  Creatinine 10/30/2021 - 9.74 mg/dL 1.63  Sodium 8.45 - 364 mmol/L 135  Potassium 3.5 - 5.1 mmol/L 3.7  Chloride 98 - 111 mmol/L 107  CO2 22 - 32 mmol/L 19(L)  Calcium 8.9 - 10.3 mg/dL 8.9  Total Protein 6.5 - 8.1 g/dL 6.6  Total Bilirubin 0.3 - 1.2 mg/dL 0.5  Alkaline Phos 38 - 126 U/L 127(H)  AST 15 - 41 U/L 26  ALT 0 - 44 U/L 19  No flowsheet data found. Vaccines: TDaP          UTD        COVID-19   UTD        Flu             UTD  Discharge instruction:  per After Visit Summary,  [facility] booklet and  "Understanding Mother & Patel Care" hospital booklet  After Visit Meds:  Allergies as of 09/03/2021       Reactions   Nickel Rash, Hives   Other Rash   Nickle         Medication List     TAKE these medications    acetaminophen 500 MG tablet Commonly known as: TYLENOL Take 2 tablets (1,000 mg total) by mouth every 6 (six) hours.   benzocaine-Menthol 20-0.5 % Aero Commonly known as: DERMOPLAST Apply 1 application topically as needed for irritation (perineal discomfort).   coconut oil Oil Apply 1 application topically as needed.   ibuprofen 600 MG tablet Commonly known  as: ADVIL Take 1 tablet (600 mg total) by mouth every 6 (six) hours.   pantoprazole 40 MG tablet Commonly known as: PROTONIX Take 40 mg by mouth every evening.   prenatal multivitamin Tabs tablet Take 1 tablet by mouth every evening.   PROBIOTIC-10 ULTIMATE PO Take 1 tablet by mouth daily.   Synthroid 200 MCG tablet Generic drug: levothyroxine Take 200-300 mcg by mouth daily. Taking 200mcg daily except on Sunday taking 300mcg What changed: Another medication with the same name was removed. Continue taking this medication, and follow the directions you see here.               Discharge Care Instructions  (From admission, onward)           Start     Ordered   09/03/21 0000  Discharge wound care:       Comments: Sitz baths 2 times /day with warm water x 1 week. May add herbals: 1 ounce dried comfrey leaf* 1 ounce calendula flowers 1 ounce lavender flowers  Supplies can be found online at [employer] sources at [employer], [employer]  1/2 ounce dried uva ursi leaves 1/2 ounce witch hazel blossoms (if you can find them) 1/2 ounce dried sage leaf 1/2 cup sea salt Directions: Bring 2 quarts of water to a boil. Turn off heat, and place 1 ounce (approximately 1 large handful) of the above mixed herbs (not the salt) into the pot. Steep, covered, for 30 minutes.  Strain the liquid well with a fine mesh strainer, and discard the herb material. Add 2 quarts of liquid to the tub, along with the 1/2 cup of salt. This medicinal liquid can also be made into compresses and peri-rinses.   09/03/21 1108            Diet: routine diet  Activity: Advance as tolerated. Pelvic rest for 6 weeks.   Postpartum contraception: TBA in office  Newborn Data: Live born female  Birth Weight: 8 lb 5 oz (3771 g) APGAR: 9, 9  Newborn Delivery   Birth date/time: 09/01/2021 23:31:00 Delivery type: Vaginal, Spontaneous      named Debbie Patel Feeding:  Breast Disposition:home with mother   Delivery Report:  Review the Delivery Report for details.    Follow up:  Follow-up Information     Neta Mendsaul, Nirvan Laban C, CNM. Schedule an appointment as soon as possible for a visit.   Specialty: Obstetrics and Gynecology Why:  For Postpartum follow-up Contact information: [address] [place] [address] [phone]                   Signed: Neta Mends, CNM, MSN 09/03/2021, 11:09 AM

## 2021-09-03 NOTE — Lactation Note (Signed)
This note was copied from a baby's chart. Lactation Consultation Note  Patient Name: Debbie Patel [id number] Date: 09/03/2021 Reason for consult: Follow-up assessment;Term Age:38 hours   P2 mother whose infant is now 23 hours old.  This is a term baby at 40+4 weeks.  Mother breast fed her first child (now 57 years old) for 5 months.  Her current feeding preference is breast.  Mother had baby "Debbie Patel" latched when I arrived.  Offered to make a few suggestions for better positioning and feeding; mother receptive.  Assisted to latch "Debbie Patel" deeply in the cross cradle hold.  She began actively sucking and had good jaw extensions.  Intermittent swallows noted.  Observed her feeding for an additional 10 minutes before leaving the room.  Reviewed feeding plan for after discharge.  Answered mother's questions.  She has our OP phone number for any further concerns.  Mother is looking forward to discharge today.   Maternal Data    Feeding Mother's Current Feeding Choice: Breast Milk  LATCH Score Latch: Grasps breast easily, tongue down, lips flanged, rhythmical sucking.  Audible Swallowing: A few with stimulation  Type of Nipple: Everted at rest and after stimulation  Comfort (Breast/Nipple): Soft / non-tender  Hold (Positioning): Assistance needed to correctly position infant at breast and maintain latch.  LATCH Score: 8   Lactation Tools Discussed/Used    Interventions Interventions: Breast feeding basics reviewed;Assisted with latch;Skin to skin;Breast massage;Hand express;Breast compression;Position options;Adjust position;Education  Discharge Discharge Education: Engorgement and breast care Pump: Personal  Consult Status Consult Status: Complete Date: 09/03/21 Follow-up type: Call as needed    Ladiamond Gallina R Karinna Beadles 09/03/2021, 10:11 AM

## 2021-09-15 ENCOUNTER — Telehealth (HOSPITAL_COMMUNITY): Payer: Self-pay | Admitting: *Deleted

## 2021-09-15 NOTE — Telephone Encounter (Signed)
Mom reports feeling good. Does have a UTI and has started antibiotics. No concerns about herself at this time. EPDS=1 [facility] score=1) Mom reports baby is doing well. Peds appt this morning for gas and reflux. Feeding, peeing, and pooping without difficulty. Safe sleep reviewed. Mom reports no concerns about baby at present.  Duffy Rhody, RN 09-15-2021 at 1:50pm

## 2023-06-21 ENCOUNTER — Ambulatory Visit: Payer: 59 | Admitting: Family

## 2023-06-21 ENCOUNTER — Encounter: Payer: Self-pay | Admitting: Family

## 2023-06-21 VITALS — BP 102/59 | HR 64 | Temp 98.7°F | Resp 16 | Ht 71.3 in | Wt 209.0 lb

## 2023-06-21 DIAGNOSIS — E039 Hypothyroidism, unspecified: Secondary | ICD-10-CM

## 2023-06-21 DIAGNOSIS — J302 Other seasonal allergic rhinitis: Secondary | ICD-10-CM | POA: Diagnosis not present

## 2023-06-21 DIAGNOSIS — Z0000 Encounter for general adult medical examination without abnormal findings: Secondary | ICD-10-CM | POA: Insufficient documentation

## 2023-06-21 DIAGNOSIS — M7918 Myalgia, other site: Secondary | ICD-10-CM | POA: Diagnosis not present

## 2023-06-21 LAB — COMPREHENSIVE METABOLIC PANEL
ALT: 16 U/L (ref 0–35)
AST: 16 U/L (ref 0–37)
Albumin: 4.5 g/dL (ref 3.5–5.2)
Alkaline Phosphatase: 72 U/L (ref 39–117)
BUN: 10 mg/dL (ref 6–23)
CO2: 28 meq/L (ref 19–32)
Calcium: 9.2 mg/dL (ref 8.4–10.5)
Chloride: 102 meq/L (ref 96–112)
Creatinine, Ser: 0.65 mg/dL (ref 0.40–1.20)
GFR: 111.13 mL/min (ref >60.00)
Glucose, Bld: 85 mg/dL (ref 70–99)
Potassium: 4.1 meq/L (ref 3.5–5.1)
Sodium: 136 meq/L (ref 135–145)
Total Bilirubin: 0.4 mg/dL (ref 0.2–1.2)
Total Protein: 7.1 g/dL (ref 6.0–8.3)

## 2023-06-21 LAB — CBC WITH DIFFERENTIAL/PLATELET
Basophils Absolute: 0.1 10*3/uL (ref 0.0–0.1)
Basophils Relative: 1 % (ref 0.0–3.0)
Eosinophils Absolute: 0.3 10*3/uL (ref 0.0–0.7)
Eosinophils Relative: 4.2 % (ref 0.0–5.0)
HCT: 41.6 % (ref 36.0–46.0)
Hemoglobin: 13.5 g/dL (ref 12.0–15.0)
Lymphocytes Relative: 26.6 % (ref 12.0–46.0)
Lymphs Abs: 2.1 10*3/uL (ref 0.7–4.0)
MCHC: 32.5 g/dL (ref 30.0–36.0)
MCV: 91.1 fL (ref 78.0–100.0)
Monocytes Absolute: 0.7 10*3/uL (ref 0.1–1.0)
Monocytes Relative: 9.3 % (ref 3.0–12.0)
Neutro Abs: 4.6 10*3/uL (ref 1.4–7.7)
Neutrophils Relative %: 58.9 % (ref 43.0–77.0)
Platelets: 314 10*3/uL (ref 150.0–400.0)
RBC: 4.57 Mil/uL (ref 3.87–5.11)
RDW: 12.7 % (ref 11.5–15.5)
WBC: 7.8 10*3/uL (ref 4.0–10.5)

## 2023-06-21 LAB — LIPID PANEL
Cholesterol: 175 mg/dL (ref 0–200)
HDL: 62.7 mg/dL (ref >39.00)
LDL Cholesterol: 87 mg/dL (ref 0–99)
NonHDL: 112.75
Total CHOL/HDL Ratio: 3
Triglycerides: 127 mg/dL (ref 0.0–149.0)
VLDL: 25.4 mg/dL (ref 0.0–40.0)

## 2023-06-21 LAB — TSH: TSH: 0.17 u[IU]/mL — ABNORMAL LOW (ref 0.35–5.50)

## 2023-06-21 NOTE — Patient Instructions (Signed)
VISIT SUMMARY:  Today, we discussed several health concerns and reviewed your current treatment plans. Your thyroid condition remains stable, and we will continue with your current medication. We also addressed your seasonal allergies, weight concerns, and occasional pain under your right breast. Additionally, we reviewed your mental health and general health maintenance.  YOUR PLAN:  -HASHIMOTO'S THYROIDITIS/POST SURGICAL HYPOTHYROIDISM: Hashimoto's Thyroiditis is an autoimmune condition where the immune system attacks the thyroid. Your condition is stable on synthroid, and you should continue your current medication. We will check your thyroid function today again in six months.  -SEASONAL ALLERGIES: Seasonal allergies can cause symptoms like coughing fits. We discussed the possibility of adding Singulair during severe allergy seasons to help manage your symptoms.  -BREAST HEALTH: You have a history of dense breast tissue and have had diagnostic mammograms. Continue with regular mammograms as recommended by your gynecologist.  -MENSTRUAL CHANGES: You have experienced heavier but shorter periods since your second childbirth. Continue with your regular gynecological care.  -MUSCULOSKELETAL PAIN: You have occasional pain under your right breast, which may be due to nerve irritation from the spine. If the pain worsens or becomes more frequent, further evaluation may be needed.  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 Continue to monitor your mental health.  -GENERAL HEALTH MAINTENANCE: We will order labs to check your kidney, liver, thyroid, cholesterol, and blood count. Please schedule an eye exam, and continue with regular walking and a healthier diet.   INSTRUCTIONS:  Please follow up in six months for a thyroid function test. Schedule an eye exam and ensure you are up-to-date on all vaccinations. If your musculoskeletal pain  worsens or becomes more frequent, seek further evaluation.

## 2023-06-21 NOTE — Assessment & Plan Note (Signed)
  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  Has discussed with ortho in the past.  Also experienced occasional pain under right breast/rib cage area. Possible mild thoracic radiculopathy.  -Consider further evaluation if pain worsens or becomes more frequent.

## 2023-06-21 NOTE — Assessment & Plan Note (Signed)
-  Order labs to check kidney, liver, thyroid, cholesterol, and blood count. -Schedule eye exam. -Encourage regular walking and healthier diet. -Immunizations reviewed and up-to-date on all vaccinations.

## 2023-06-21 NOTE — Assessment & Plan Note (Signed)
Sometimes during the fall she has breakthrough symptoms including cough despite zyrtec and flonase.  Advised her we could try singulair during these periods in the future.

## 2023-06-21 NOTE — Assessment & Plan Note (Signed)
Clinically stable on synthroid 200 mcg once daily. Update TSH.

## 2023-06-21 NOTE — Progress Notes (Signed)
Subjective:     Patient ID: Debbie Patel, female    DOB: 1986/03/19, 37 y.o.   MRN: [id number]  Chief Complaint  Patient presents with   New Patient (Initial Visit)         HPI  Discussed the use of AI scribe software for clinical note transcription with the patient, who gave verbal consent to proceed.  History of Present Illness  The patient is a 37 year old female with a history of Hashimoto's thyroiditis, status post thyroidectomy in 2013, and cholecystectomy in 2018. She is currently on thyroid replacement therapy and is followed by an endocrinologist. She reports that her thyroid function has been relatively stable with occasional adjustments to her medication.  The patient expresses concern about her current weight, stating that she is at her heaviest and would like to lose weight. She admits to not having resumed her workout routine since the birth of her second child and acknowledges the need for a healthier diet.  The patient also reports experiencing postnasal drip during allergy seasons, which she describes as severe and causing coughing fits. She has tried various strategies, including Flonase and Zyrtec, but finds that these are not always effective.  Additionally, the patient mentions occasional pain under her right breast, which she first noticed after running. She sought medical attention for this at an urgent care center, where imaging did not reveal any abnormalities. The pain persists intermittently, particularly when she raises her shoulders.  The patient has two children and her husband has had a vasectomy. She has a family history of stroke, esophageal cancer, Alzheimer's disease, and heart disease.     Dr. Jonn Shing[facility]  Ivonne Andrew CNM pap up to date [facility]  Immunizations: flu and covid vaccines up to date Diet:  needs improvement Wt Readings from Last 3 Encounters:  06/21/23 209 lb (94.8 kg)  09/01/21 225 lb 8 oz (102.3  kg)  11/30/16 220 lb (99.8 kg)  Exercise: needs improvement Vision: due Dental: up to date Health Maintenance Due  Topic Date Due   Hepatitis C Screening  Never done   HPV VACCINES (2 - 3-dose series) 02/24/2006   Cervical Cancer Screening (HPV/Pap Cotest)  Never done    Past Medical History:  Diagnosis Date   Eustachian tube dysfunction    Hashimoto's thyroiditis    Hypothyroidism    Nontoxic multinodular goiter     Past Surgical History:  Procedure Laterality Date   CHOLECYSTECTOMY  05/29/2017   patient reports   CYSTECTOMY Left    left wrist ganglion cyst   THYROIDECTOMY  08/20/2012   WISDOM TOOTH EXTRACTION      Family History  Problem Relation Age of Onset   Stroke Mother    Esophageal cancer Father        heavy smoker and drink   Tremor Brother    Drug abuse Brother    CVA Maternal Grandmother    Alzheimer's disease Maternal Grandfather    Alzheimer's disease Paternal Grandmother    CAD Paternal Grandfather     Social History   Socioeconomic History   Marital status: Married    Spouse name: Not on file   Number of children: Not on file   Years of education: Not on file   Highest education level: Not on file  Occupational History   Not on file  Tobacco Use   Smoking status: Never   Smokeless tobacco: Never  Vaping Use   Vaping status: Never Used  Substance and Sexual  Activity   Alcohol use: Yes    Comment: occasionally   Drug use: No   Sexual activity: Yes    Partners: Male    Birth control/protection: None, Surgical  Other Topics Concern   Not on file  Social History Narrative   Mom is Scarlett Presto   Has 2 children born 2023 (daughter)   2018 (son)   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   Married   Completed masters degree in Animal nutritionist   Enjoys spending time with friends, signs in church, former Horticulturist, commercial   Social Determinants of Corporate investment banker Strain: Not on file  Food Insecurity: Low Risk   (05/23/2023)   Received from [facility]   Hunger Vital Sign    Worried About Running Out of Food in the Last Year: Never true    Ran Out of Food in the Last Year: Never true  Transportation Needs: No Transportation Needs (05/23/2023)   Received from [facility]    In the past 12 months, has lack of reliable transportation kept you from medical appointments, meetings, work or from getting things needed for daily living? : No  Physical Activity: Not on file  Stress: Not on file  Social Connections: Unknown (07/09/2022)   Received from [facility], [facility]   Social Network    Social Network: Not on file  Intimate Partner Violence: Unknown (07/09/2022)   Received from [facility], [facility]   HITS    Physically Hurt: Not on file    Insult or Talk Down To: Not on file    Threaten Physical Harm: Not on file    Scream or Curse: Not on file    Outpatient Medications Prior to Visit  Medication Sig Dispense Refill   acetaminophen (TYLENOL) 500 MG tablet Take 2 tablets (1,000 mg total) by mouth every 6 (six) hours. 30 tablet 0   ibuprofen (ADVIL) 600 MG tablet Take 1 tablet (600 mg total) by mouth every 6 (six) hours. 30 tablet 0   SYNTHROID 200 MCG tablet Take 200-300 mcg by mouth daily. Taking daily except on Sunday taking     benzocaine-Menthol (DERMOPLAST) 20-0.5 % AERO Apply 1 application topically as needed for irritation (perineal discomfort).     coconut oil OIL Apply 1 application topically as needed.  0   pantoprazole (PROTONIX) 40 MG tablet Take 40 mg by mouth every evening.     Prenatal Vit-Fe Fumarate-FA (PRENATAL MULTIVITAMIN) TABS tablet Take 1 tablet by mouth every evening.     Probiotic Product (PROBIOTIC-10 ULTIMATE PO) Take 1 tablet by mouth daily.     No facility-administered medications prior to visit.    Allergies  Allergen Reactions   Nickel Rash and Hives   Other Rash    Nickle     Review of Systems   Constitutional:  Negative for weight loss.  HENT:  Negative for hearing loss.        Post nasal drip with her allergies.   Eyes:  Negative for blurred vision.  Respiratory:  Negative for cough.   Cardiovascular:  Negative for leg swelling.  Gastrointestinal:  Negative for constipation and diarrhea.  Genitourinary:  Negative for dysuria and hematuria.       Periods are short 2-3 days, heavy during those days  Musculoskeletal:  Positive for joint pain (chronic left wrist pain).  Skin:  Negative for rash.  Neurological:  Negative for headaches.  Psychiatric/Behavioral:  Had some depression last winter post partum, but better after seeing therapist.        Objective:    Physical Exam   BP (!) 102/59 (BP Location: Right Arm, Patient Position: Sitting, Cuff Size: Large)   Pulse 64   Temp 98.7 F (37.1 C) (Oral)   Resp 16   Ht 5' 11.3" (1.811 m)   Wt 209 lb (94.8 kg)   SpO2 98%   BMI 28.90 kg/m  Wt Readings from Last 3 Encounters:  06/21/23 209 lb (94.8 kg)  09/01/21 225 lb 8 oz (102.3 kg)  11/30/16 220 lb (99.8 kg)  Physical Exam  Constitutional: She is oriented to person, place, and time. She appears well-developed and well-nourished. No distress.  HENT:  Head: Normocephalic and atraumatic.  Right Ear: Tympanic membrane and ear canal normal.  Left Ear: Tympanic membrane and ear canal normal.  Mouth/Throat: Oropharynx is clear and moist.  Eyes: Pupils are equal, round, and reactive to light. No scleral icterus.  Neck: Normal range of motion. No thyromegaly present.  Cardiovascular: Normal rate and regular rhythm.   No murmur heard. Pulmonary/Chest: Effort normal and breath sounds normal. No respiratory distress. He has no wheezes. She has no rales. She exhibits no tenderness.  Abdominal: Soft. Bowel sounds are normal. She exhibits no distension and no mass. There is no tenderness. There is no rebound and no guarding.  Musculoskeletal: She exhibits no edema.   Lymphadenopathy:    She has no cervical adenopathy.  Neurological: She is alert and oriented to person, place, and time. She has normal patellar reflexes. She exhibits normal muscle tone. Coordination normal.  Skin: Skin is warm and dry.  Psychiatric: She has a normal mood and affect. Her behavior is normal. Judgment and thought content normal.  Breast/pelvic: deferred           Assessment & Plan:        Assessment & Plan:   Problem List Items Addressed This Visit       Unprioritized   Seasonal allergies - Primary    Sometimes during the fall she has breakthrough symptoms including cough despite zyrtec and flonase.  Advised her we could try singulair during these periods in the future.       Preventative health care     -Order labs to check kidney, liver, thyroid, cholesterol, and blood count. -Schedule eye exam. -Encourage regular walking and healthier diet. -Immunizations reviewed and up-to-date on all vaccinations.      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  Has discussed with ortho in the past.  Also experienced occasional pain under right breast/rib cage area. Possible mild thoracic radiculopathy.  -Consider further evaluation if pain worsens or becomes more frequent.      Hypothyroidism    Clinically stable on synthroid 200 mcg once daily. Update TSH.       Relevant Orders   TSH    I have discontinued Cimberly Uresti "KATIE"'s pantoprazole, Probiotic Product (PROBIOTIC-10 ULTIMATE PO), prenatal multivitamin, coconut oil, and benzocaine-Menthol. I am also having her maintain her Synthroid, acetaminophen, and ibuprofen.  No orders of the defined types were placed in this encounter.

## 2023-06-22 ENCOUNTER — Telehealth: Payer: Self-pay | Admitting: Family

## 2023-06-22 NOTE — Telephone Encounter (Signed)
See mychart.  

## 2023-07-10 NOTE — Telephone Encounter (Signed)
Results electronically faxed to Dr. Evlyn Kanner

## 2023-07-10 NOTE — Telephone Encounter (Signed)
Please forward TSH to Dr. Evlyn Kanner her endocrinologist.

## 2023-08-15 ENCOUNTER — Encounter: Payer: Self-pay | Admitting: Family

## 2023-08-15 MED ORDER — MONTELUKAST SODIUM 10 MG PO TABS: 10.0000 mg | ORAL_TABLET | Freq: Every day | ORAL | 0 refills | Status: DC

## 2023-09-04 ENCOUNTER — Ambulatory Visit: Payer: Managed Care, Other (non HMO) | Admitting: Family

## 2023-09-04 ENCOUNTER — Other Ambulatory Visit (HOSPITAL_BASED_OUTPATIENT_CLINIC_OR_DEPARTMENT_OTHER): Payer: Self-pay

## 2023-09-04 ENCOUNTER — Telehealth: Payer: Self-pay | Admitting: Family

## 2023-09-04 VITALS — BP 121/79 | HR 91 | Temp 99.0°F | Resp 16 | Ht 71.3 in | Wt 214.0 lb

## 2023-09-04 DIAGNOSIS — H6690 Otitis media, unspecified, unspecified ear: Secondary | ICD-10-CM | POA: Insufficient documentation

## 2023-09-04 DIAGNOSIS — H6693 Otitis media, unspecified, bilateral: Secondary | ICD-10-CM | POA: Diagnosis not present

## 2023-09-04 MED ORDER — GUAIFENESIN-CODEINE 100-10 MG/5ML PO SOLN
10.0000 mL | Freq: Three times a day (TID) | ORAL | 0 refills | Status: DC | PRN
  Filled 2023-09-04: qty 150, 5d supply, fill #0

## 2023-09-04 MED ORDER — PSEUDOEPHEDRINE HCL 30 MG PO TABS
30.0000 mg | ORAL_TABLET | Freq: Four times a day (QID) | ORAL | 0 refills | Status: DC | PRN
  Filled 2023-09-04: qty 24, 6d supply, fill #0

## 2023-09-04 MED ORDER — CEFDINIR 300 MG PO CAPS
300.0000 mg | ORAL_CAPSULE | Freq: Two times a day (BID) | ORAL | 0 refills | Status: DC
  Filled 2023-09-04: qty 14, 7d supply, fill #0

## 2023-09-04 MED ORDER — PSEUDOEPHEDRINE-CODEINE-GG 30-10-100 MG/5ML PO SOLN
10.0000 mL | Freq: Four times a day (QID) | ORAL | 0 refills | Status: DC | PRN
  Filled 2023-09-04: qty 118, 3d supply, fill #0

## 2023-09-04 NOTE — Telephone Encounter (Signed)
 See mychart.

## 2023-09-04 NOTE — Assessment & Plan Note (Signed)
 Ear exam is improved when compared to documentation from Urgent Care.  However, clinically she is not very improved.  She is also having significant congestion, throat pain and cough.  Will broaden her antibiotics from amoxicillin  to Cefdinir .  For cough/pain will rx with cheratussin.  For congestion, will rx sudafed.  She is advised ok to also continue tylenol /motrin  as needed. She is advised to call if symptom worsen or if no improvement in 3-4 days. Go to ER if symptoms become severe.

## 2023-09-04 NOTE — Progress Notes (Signed)
 Subjective:     Patient ID: Debbie Patel, female    DOB: 1986/03/16, 38 y.o.   MRN: [id number]  Chief Complaint  Patient presents with   Otalgia    Complains of bilateral ear pain, worse on the right   Nasal Congestion    Complains of congestion   Cough    Patient complains of productive cough    HPI  Discussed the use of AI scribe software for clinical note transcription with the patient, who gave verbal consent to proceed.  History of Present Illness   The patient, with a recent history of pink eye that resolved with antibiotic eye drops, presents with persistent symptoms of nasal congestion, bilateral facial pain, bilateral ear pain, and a sore throat.  The patient was unable to breathe through the nose, leading to sleep disturbances. The patient then developed ringing in both ears, with the right ear being more affected. The patient also reports fluctuating fever, which seems to be controlled with the use of Advil . The patient has been on amoxicillin  since visiting urgent care on 09/02/23 where she was diagnosed with otitis media. She reports minimal improvement in symptoms since beginning amoxicillin .  She reports that she tested negative for covid and flu at urgent care on 09/01/22. The patient also mentions a sore or blister-like sensation in the throat, which causes pain upon swallowing. The patient's symptoms seem to worsen at night, with increased coughing and swelling of a lymph node on the right side of her upper anterior neck.  The patient also reports green mucus production.          Health Maintenance Due  Topic Date Due   Hepatitis C Screening  Never done   HPV VACCINES (2 - 3-dose series) 02/24/2006   Cervical Cancer Screening (HPV/Pap Cotest)  Never done   COVID-19 Vaccine (5 - 2024-25 season) 07/25/2023    Past Medical History:  Diagnosis Date   Eustachian tube dysfunction    Hashimoto's thyroiditis    Hypothyroidism    Nontoxic multinodular goiter      Past Surgical History:  Procedure Laterality Date   CHOLECYSTECTOMY  05/29/2017   patient reports   CYSTECTOMY Left    left wrist ganglion cyst   THYROIDECTOMY  08/20/2012   WISDOM TOOTH EXTRACTION      Family History  Problem Relation Age of Onset   Stroke Mother    Esophageal cancer Father        heavy smoker and drink   Tremor Brother    Drug abuse Brother    CVA Maternal Grandmother    Alzheimer's disease Maternal Grandfather    Alzheimer's disease Paternal Grandmother    CAD Paternal Grandfather     Social History   Socioeconomic History   Marital status: Married    Spouse name: Not on file   Number of children: Not on file   Years of education: Not on file   Highest education level: Not on file  Occupational History   Not on file  Tobacco Use   Smoking status: Never   Smokeless tobacco: Never  Vaping Use   Vaping status: Never Used  Substance and Sexual Activity   Alcohol use: Yes    Comment: occasionally   Drug use: No   Sexual activity: Yes    Partners: Male    Birth control/protection: None, Surgical  Other Topics Concern   Not on file  Social History Narrative   Mom is Glendale Console   Has 2 children  born 2023 (daughter)   2018 (son)   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   Married   Completed masters degree in Animal nutritionist   Enjoys spending time with friends, signs in church, former horticulturist, commercial   Social Drivers of Corporate Investment Banker Strain: Not on [employer] Insecurity: Low Risk  (05/23/2023)   Received from [facility]   Hunger Vital Sign    Worried About Running Out of Food in the Last Year: Never true    Ran Out of Food in the Last Year: Never true  Transportation Needs: No Transportation Needs (05/23/2023)   Received from [facility]    In the past 12 months, has lack of reliable transportation kept you from medical appointments, meetings, work or from getting things needed for  daily living? : No  Physical Activity: Not on file  Stress: Not on file  Social Connections: Unknown (07/09/2022)   Received from [facility], [facility]   Social Network    Social Network: Not on file  Intimate Partner Violence: Unknown (07/09/2022)   Received from [facility], [facility]   HITS    Physically Hurt: Not on file    Insult or Talk Down To: Not on file    Threaten Physical Harm: Not on file    Scream or Curse: Not on file    Outpatient Medications Prior to Visit  Medication Sig Dispense Refill   acetaminophen  (TYLENOL ) 500 MG tablet Take 2 tablets (1,000 mg total) by mouth every 6 (six) hours. 30 tablet 0   ibuprofen  (ADVIL ) 600 MG tablet Take 1 tablet (600 mg total) by mouth every 6 (six) hours. 30 tablet 0   montelukast  (SINGULAIR ) 10 MG tablet Take 1 tablet (10 mg total) by mouth at bedtime. 90 tablet 0   SYNTHROID  200 MCG tablet Take 200-300 mcg by mouth daily. Taking 200mcg daily except on Sunday taking 300mcg     No facility-administered medications prior to visit.    Allergies  Allergen Reactions   Nickel Rash and Hives   Other Rash    Nickle     ROS See HPI    Objective:    Physical Exam Constitutional:      General: She is not in acute distress.    Appearance: Normal appearance. She is well-developed.  HENT:     Head: Normocephalic and atraumatic.     Right Ear: External ear normal. Tympanic membrane is retracted. Tympanic membrane is not erythematous.     Left Ear: External ear normal. Tympanic membrane is retracted. Tympanic membrane is not erythematous.     Mouth/Throat:     Pharynx: Posterior oropharyngeal erythema present. No pharyngeal swelling, oropharyngeal exudate or uvula swelling.     Tonsils: 2+ on the right. 2+ on the left.     Comments: Unable to completely visualize both tonsils despite use of tongue depressor Eyes:     General: No scleral icterus. Neck:     Thyroid : No thyromegaly.  Cardiovascular:     Rate and  Rhythm: Normal rate and regular rhythm.     Heart sounds: Normal heart sounds. No murmur heard. Pulmonary:     Effort: Pulmonary effort is normal. No respiratory distress.     Breath sounds: Normal breath sounds. No wheezing.  Musculoskeletal:     Cervical back: Neck supple.  Lymphadenopathy:     Cervical: Cervical adenopathy present.  Skin:    General: Skin is warm and dry.  Neurological:     Mental Status: She is alert and oriented to person, place, and time.  Psychiatric:        Mood and Affect: Mood normal.        Behavior: Behavior normal.        Thought Content: Thought content normal.        Judgment: Judgment normal.      BP 121/79 (BP Location: Right Arm, Patient Position: Sitting, Cuff Size: Large)   Pulse 91   Temp 99 F (37.2 C) (Oral)   Resp 16   Ht 5' 11.3 (1.811 m)   Wt 214 lb (97.1 kg)   SpO2 97%   BMI 29.60 kg/m  Wt Readings from Last 3 Encounters:  09/04/23 214 lb (97.1 kg)  06/21/23 209 lb (94.8 kg)  09/01/21 225 lb 8 oz (102.3 kg)       Assessment & Plan:   Problem List Items Addressed This Visit       Unprioritized   Acute otitis media - Primary   Ear exam is improved when compared to documentation from Urgent Care.  However, clinically she is not very improved.  She is also having significant congestion, throat pain and cough.  Will broaden her antibiotics from amoxicillin  to Cefdinir .  For cough/pain will rx with cheratussin.  For congestion, will rx sudafed.  She is advised ok to also continue tylenol /motrin  as needed. She is advised to call if symptom worsen or if no improvement in 3-4 days. Go to ER if symptoms become severe.       Relevant Medications   cefdinir  (OMNICEF ) 300 MG capsule    I have discontinued Reathel Yonkers KATIE's pseudoephedrine -codeine -guaifenesin . I am also having her start on cefdinir , guaiFENesin -codeine , and pseudoephedrine . Additionally, I am having her maintain her Synthroid , acetaminophen , ibuprofen , and  montelukast .  Meds ordered this encounter  Medications   cefdinir  (OMNICEF ) 300 MG capsule    Sig: Take 1 capsule (300 mg total) by mouth 2 (two) times daily.    Dispense:  14 capsule    Refill:  0    Supervising Provider:   DOMENICA BLACKBIRD A [[id number]]   DISCONTD: pseudoephedrine -codeine -guaifenesin  (MYTUSSIN DAC) 30-10-100 MG/5ML solution    Sig: Take 10 mLs by mouth 4 (four) times daily as needed for cough.    Dispense:  118 mL    Refill:  0    Supervising Provider:   DOMENICA BLACKBIRD A [[id number]]   guaiFENesin -codeine  100-10 MG/5ML syrup    Sig: Take 10 mLs by mouth every 8 (eight) hours as needed (cough/throat pain).    Dispense:  150 mL    Refill:  0    Provider approved change to 100mg /10mg  09/04/23- [facility]    Supervising Provider:   DOMENICA BLACKBIRD A [[id number]]   pseudoephedrine  (SUDAFED) 30 MG tablet    Sig: Take 1 tablet (30 mg total) by mouth every 6 (six) hours as needed for congestion.    Dispense:  24 tablet    Refill:  0    Supervising Provider:   DOMENICA BLACKBIRD A [[id number]]

## 2023-09-04 NOTE — Patient Instructions (Signed)
 VISIT SUMMARY:  You came in today with ongoing symptoms of nasal congestion, facial pain, ear pain, and a sore throat, which started after you began treatment for pink eye. Despite being on amoxicillin , your symptoms have not improved much, and you are experiencing additional issues like ringing in your ears, fluctuating fever, and green mucus production.  YOUR PLAN:  -CONJUNCTIVITIS: Your pink eye has resolved with the use of eye drops, so no further treatment is needed.  -UPPER RESPIRATORY INFECTION: You have an upper respiratory infection, which is an infection affecting your nose, throat, and airways. Since the initial treatment with amoxicillin  has not been very effective, we are switching you to a different antibiotic called Cefdinir . This should help cover a broader range of bacteria. Additionally, you will start taking a cough syrup with codeine  to help relieve your symptoms, especially at night. Continue using over-the-counter pain relief as needed. We will check in 3-4 days to see how you are responding to the new antibiotic.  -GENERAL HEALTH MAINTENANCE: While you are on antibiotics, it is important to maintain your gut health. Increasing your intake of probiotics can help with this.  INSTRUCTIONS:  Please follow up in 3-4 days to assess your response to the new antibiotic, Cefdinir .

## 2023-09-04 NOTE — Telephone Encounter (Signed)
 Pt took an amoxicillin today and was prescribed another antibiotic today at her visit. Pt wants to know if she can take new antibiotic. LVM that patient can take new antibiotic today as per CMA.

## 2023-09-07 ENCOUNTER — Other Ambulatory Visit (HOSPITAL_BASED_OUTPATIENT_CLINIC_OR_DEPARTMENT_OTHER): Payer: Self-pay

## 2023-10-07 ENCOUNTER — Encounter: Payer: Self-pay | Admitting: Family

## 2023-10-07 DIAGNOSIS — T7840XA Allergy, unspecified, initial encounter: Secondary | ICD-10-CM

## 2023-10-11 ENCOUNTER — Ambulatory Visit: Payer: Managed Care, Other (non HMO) | Admitting: Family

## 2023-10-11 ENCOUNTER — Ambulatory Visit (HOSPITAL_BASED_OUTPATIENT_CLINIC_OR_DEPARTMENT_OTHER)
Admission: RE | Admit: 2023-10-11 | Discharge: 2023-10-11 | Disposition: A | Discharge status: Home / Self Care | Payer: Managed Care, Other (non HMO) | Source: Ambulatory Visit | Attending: Family | Admitting: Family

## 2023-10-11 ENCOUNTER — Encounter: Payer: Self-pay | Admitting: Family

## 2023-10-11 ENCOUNTER — Other Ambulatory Visit (HOSPITAL_BASED_OUTPATIENT_CLINIC_OR_DEPARTMENT_OTHER): Payer: Self-pay

## 2023-10-11 ENCOUNTER — Telehealth: Payer: Self-pay | Admitting: Family

## 2023-10-11 VITALS — BP 108/63 | HR 65 | Temp 98.6°F | Resp 16 | Ht 71.3 in | Wt 211.0 lb

## 2023-10-11 DIAGNOSIS — R059 Cough, unspecified: Secondary | ICD-10-CM

## 2023-10-11 MED ORDER — ALBUTEROL SULFATE HFA 108 (90 BASE) MCG/ACT IN AERS
2.0000 | INHALATION_SPRAY | Freq: Four times a day (QID) | RESPIRATORY_TRACT | 0 refills | Status: DC | PRN
  Filled 2023-10-11: qty 6.7, 25d supply, fill #0

## 2023-10-11 MED ORDER — BENZONATATE 100 MG PO CAPS
100.0000 mg | ORAL_CAPSULE | Freq: Three times a day (TID) | ORAL | 0 refills | Status: DC | PRN
  Filled 2023-10-11: qty 20, 7d supply, fill #0

## 2023-10-11 MED ORDER — PREDNISONE 10 MG PO TABS
ORAL_TABLET | ORAL | 0 refills | Status: DC
  Filled 2023-10-11: qty 20, 8d supply, fill #0

## 2023-10-11 NOTE — Telephone Encounter (Signed)
See mychart.

## 2023-10-11 NOTE — Patient Instructions (Signed)
VISIT SUMMARY:  Today, we discussed your persistent cough and difficulty breathing, which have been ongoing for over a month. We reviewed your symptoms and previous treatments, and we have developed a plan to address your current condition.  YOUR PLAN:  -PERSISTENT COUGH: A persistent cough lasting over a month can be due to various reasons, including infections or asthma. We will start you on a Prednisone taper for 7 days and an Albuterol inhaler to help with your breathing. Additionally, Tessalon will be provided to suppress your cough as needed. A chest x-ray will be done to rule out pneumonia or other lung issues.  -ALLERGY REFERRAL: An allergy referral is pending to determine if allergies are contributing to your symptoms. Please continue to follow up with the allergy specialist as planned.  INSTRUCTIONS:  Please monitor your response to the treatment and schedule a follow-up appointment after you complete the Prednisone course. Additionally, ensure you get the chest x-ray done as soon as possible.

## 2023-10-11 NOTE — Progress Notes (Signed)
ju  Subjective:     Patient ID: Debbie Patel, female    DOB: 09/27/1985, 38 y.o.   MRN: [id number]  Chief Complaint  Patient presents with   Cough    Complains of persistent cough    Cough    Discussed the use of AI scribe software for clinical note transcription with the patient, who gave verbal consent to proceed.  History of Present Illness   Debbie Patel "Debbie Patel" is a 38 year old female who presents with a persistent cough and difficulty breathing.  She has been experiencing a persistent cough and difficulty breathing for over a month. Initially, she was treated for an ear infection in early January with amoxicillin, which was ineffective, and subsequently with cefdinir, which resolved her ear symptoms, but the cough persisted.  In early February, she began to feel better, but the cough remained intermittently. Recently, after a walk in hot weather, she experienced severe bronchial spasms and difficulty catching her breath, which have persisted. The cough is now described as 'chest heavy' and 'spasmy', with episodes of choking while eating and prolonged coughing fits.  The cough has evolved into a dry, 'all air' cough, unlike before when she would cough up green phlegm. It is severe enough to disrupt her sleep, causing her to sleep in a separate room to avoid disturbing her husband. Coughing also makes her nose run. Her husband has observed her having difficulty breathing during these episodes.  She has not been contacted about an allergy referral yet. She has a history of using albuterol for her son, who experienced similar symptoms in the past.          Health Maintenance Due  Topic Date Due   Hepatitis C Screening  Never done   HPV VACCINES (2 - 3-dose series) 02/24/2006   Cervical Cancer Screening (HPV/Pap Cotest)  Never done   COVID-19 Vaccine (5 - 2024-25 season) 07/25/2023    Past Medical History:  Diagnosis Date   Eustachian tube dysfunction     Hashimoto's thyroiditis    Hypothyroidism    Nontoxic multinodular goiter     Past Surgical History:  Procedure Laterality Date   CHOLECYSTECTOMY  05/29/2017   patient reports   CYSTECTOMY Left    left wrist ganglion cyst   THYROIDECTOMY  08/20/2012   WISDOM TOOTH EXTRACTION      Family History  Problem Relation Age of Onset   Stroke Mother    Esophageal cancer Father        heavy smoker and drink   Tremor Brother    Drug abuse Brother    CVA Maternal Grandmother    Alzheimer's disease Maternal Grandfather    Alzheimer's disease Paternal Grandmother    CAD Paternal Grandfather     Social History   Socioeconomic History   Marital status: Married    Spouse name: Not on file   Number of children: Not on file   Years of education: Not on file   Highest education level: Not on file  Occupational History   Not on file  Tobacco Use   Smoking status: Never   Smokeless tobacco: Never  Vaping Use   Vaping status: Never Used  Substance and Sexual Activity   Alcohol use: Yes    Comment: occasionally   Drug use: No   Sexual activity: Yes    Partners: Male    Birth control/protection: None, Surgical  Other Topics Concern   Not on file  Social History Narrative   Mom is  Debbie Patel   Has 2 children born 2023 (daughter)   2018 (son)   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   Married   Completed masters degree in Animal nutritionist   Enjoys spending time with friends, signs in church, former Horticulturist, commercial   Social Drivers of Corporate investment banker Strain: Not on [employer] Insecurity: Low Risk  (05/23/2023)   Received from [facility]   Hunger Vital Sign    Worried About Running Out of Food in the Last Year: Never true    Ran Out of Food in the Last Year: Never true  Transportation Needs: No Transportation Needs (05/23/2023)   Received from [facility]    In the past 12 months, has lack of reliable transportation kept you  from medical appointments, meetings, work or from getting things needed for daily living? : No  Physical Activity: Not on file  Stress: Not on file  Social Connections: Unknown (07/09/2022)   Received from [facility], [facility]   Social Network    Social Network: Not on file  Intimate Partner Violence: Unknown (07/09/2022)   Received from [facility], [facility]   HITS    Physically Hurt: Not on file    Insult or Talk Down To: Not on file    Threaten Physical Harm: Not on file    Scream or Curse: Not on file    Outpatient Medications Prior to Visit  Medication Sig Dispense Refill   acetaminophen (TYLENOL) 500 MG tablet Take 2 tablets (1,000 mg total) by mouth every 6 (six) hours. 30 tablet 0   ibuprofen (ADVIL) 600 MG tablet Take 1 tablet (600 mg total) by mouth every 6 (six) hours. 30 tablet 0   montelukast (SINGULAIR) 10 MG tablet Take 1 tablet (10 mg total) by mouth at bedtime. 90 tablet 0   pseudoephedrine (SUDAFED) 30 MG tablet Take 1 tablet (30 mg total) by mouth every 6 (six) hours as needed for congestion. 24 tablet 0   SYNTHROID 200 MCG tablet Take 200-300 mcg by mouth daily. Taking daily except on Sunday taking     cefdinir (OMNICEF) 300 MG capsule Take 1 capsule (300 mg total) by mouth 2 (two) times daily. 14 capsule 0   guaiFENesin-codeine 100-10 MG/5ML syrup Take 10 mLs by mouth every 8 (eight) hours as needed (cough/throat pain). 150 mL 0   No facility-administered medications prior to visit.    Allergies  Allergen Reactions   Nickel Rash and Hives   Other Rash    Nickle     Review of Systems  Respiratory:  Positive for cough.        Objective:    Physical Exam Constitutional:      General: She is not in acute distress.    Appearance: Normal appearance. She is well-developed.  HENT:     Head: Normocephalic and atraumatic.     Right Ear: External ear normal.     Left Ear: External ear normal.  Eyes:     General: No scleral  icterus. Neck:     Thyroid: No thyromegaly.  Cardiovascular:     Rate and Rhythm: Normal rate and regular rhythm.     Heart sounds: Normal heart sounds. No murmur heard. Pulmonary:     Effort: Pulmonary effort is normal. No respiratory distress.     Breath sounds: Normal breath sounds. No wheezing.  Musculoskeletal:     Cervical back: Neck supple.  Skin:  General: Skin is warm and dry.  Neurological:     Mental Status: She is alert and oriented to person, place, and time.  Psychiatric:        Mood and Affect: Mood normal.        Behavior: Behavior normal.        Thought Content: Thought content normal.        Judgment: Judgment normal.      BP 108/63 (BP Location: Right Arm, Patient Position: Sitting, Cuff Size: Normal)   Pulse 65   Temp 98.6 F (37 C) (Oral)   Resp 16   Ht 5' 11.3" (1.811 m)   Wt 211 lb (95.7 kg)   SpO2 98%   BMI 29.18 kg/m  Wt Readings from Last 3 Encounters:  10/11/23 211 lb (95.7 kg)  09/04/23 214 lb (97.1 kg)  06/21/23 209 lb (94.8 kg)       Assessment & Plan:   Problem List Items Addressed This Visit       Unprioritized   Cough - Primary   Cough persisting for over a month, initially improved but worsened again with spasmodic, deep coughing episodes. No productive sputum. Recent history of ear infection treated with Amoxicillin and Cefdinir. Symptoms suggestive of possible asthma exacerbation. -Order chest x-ray to rule out pneumonia or other lung pathology. -Start Prednisone taper over 7 days and Albuterol inhaler (2 puffs every 6 hours as needed). -Provide Tessalon for cough suppression as needed. -Pending referral for allergy evaluation.      Relevant Medications   predniSONE (DELTASONE) 10 MG tablet   albuterol (VENTOLIN HFA) 108 (90 Base) MCG/ACT inhaler   benzonatate (TESSALON) 100 MG capsule   Other Relevant Orders   DG Chest 2 View    I have discontinued Lealer Marsland "KATIE"'s cefdinir and guaiFENesin-codeine. I am  also having her start on predniSONE, albuterol, and benzonatate. Additionally, I am having her maintain her Synthroid, acetaminophen, ibuprofen, montelukast, and pseudoephedrine.  Meds ordered this encounter  Medications   predniSONE (DELTASONE) 10 MG tablet    Sig: 4 tabs by mouth once daily for 2 days, then 3 tabs daily x 2 days, then 2 tabs daily x 2 days, then 1 tab daily x 2 days    Dispense:  20 tablet    Refill:  0    Supervising Provider:   Danise Edge A [[id number]]   albuterol (VENTOLIN HFA) 108 (90 Base) MCG/ACT inhaler    Sig: Inhale 2 puffs into the lungs every 6 (six) hours as needed for wheezing or shortness of breath.    Dispense:  6.7 g    Refill:  0    Supervising Provider:   Danise Edge A [[id number]]   benzonatate (TESSALON) 100 MG capsule    Sig: Take 1 capsule (100 mg total) by mouth 3 (three) times daily as needed.    Dispense:  20 capsule    Refill:  0    Supervising Provider:   Danise Edge A [[id number]]

## 2023-10-11 NOTE — Assessment & Plan Note (Addendum)
Cough persisting for over a month, initially improved but worsened again with spasmodic, deep coughing episodes. No productive sputum. Recent history of ear infection treated with Amoxicillin and Cefdinir. Symptoms suggestive of possible asthma exacerbation. -Order chest x-ray to rule out pneumonia or other lung pathology. -Start Prednisone taper over 7 days and Albuterol inhaler (2 puffs every 6 hours as needed). -Provide Tessalon for cough suppression as needed. -Pending referral for allergy evaluation.

## 2023-10-22 NOTE — Progress Notes (Unsigned)
 NEW PATIENT Date of Service/Encounter:  10/23/23 Referring provider: Sandford Craze, NP Primary care provider: Sandford Craze, NP  Subjective:  Debbie Patel is a 38 y.o. female with a PMHx of hypothyroidism due to [facility] presenting today for evaluation of chronic rhinitis and cough. History obtained from: chart review and patient.   Discussed the use of AI scribe software for clinical note transcription with the patient, who gave verbal consent to proceed.  History of Present Illness   Debbie Patel "Debbie Patel" is a 38 year old female who presents with a persistent cough and breathing difficulties.  She experiences a persistent cough and breathing difficulties, particularly during the fall and spring seasons. These symptoms have been ongoing since having children. Her children are now ages 65 and 37 yo. The cough often lasts for two weeks and is exacerbated by physical activity such as running. She describes episodes where she is unable to catch her breath, which occurred multiple times in January after a viral illness brought home by her daughter.  In January, she experienced a severe episode where she could not catch her breath after a walk, prompting a visit to her primary care provider. She was prescribed an albuterol inhaler and a tapered course of oral prednisone, which resolved her cough within two days. She used the albuterol every six hours for three days, then reduced the frequency over the following days. She has had occasional coughing fits since then, but they have been less frequent.  She has a history of seasonal allergies, particularly in the spring and fall, and has been on Zyrtec for at least three years. Singulair was added to her regimen in October, and she has used Flonase for several years. She reports that Singulair did not seem to help initially, but she resumed it after her symptoms worsened upon stopping.  She experiences post-nasal drip and bloodshot  eyes, particularly noticeable since October of the previous year. She has not used any eye drops. No history of asthma as a child but notes a spastic cough when sick in adulthood. She has had eczema since her early twenties, which is managed with moisturizers.  No history of sinus, adenoid, or tonsil surgeries, and she has never smoked or been exposed to secondhand smoke. Her vaccinations are up to date. She has no known medication or food allergies, nor any significant insect allergies.  She works from home and lives in [place]. She has two children, aged six and two.      Chart Review:  Reviewed PCP notes from referral 10/09/23: chronic cough following illness and chronic rhintis, treated with albuterol and prednisone taper   Past Medical History: Past Medical History:  Diagnosis Date   Eczema    Eustachian tube dysfunction    Hashimoto's thyroiditis    Hypothyroidism    Nontoxic multinodular goiter    Urticaria    Medication List:  Current Outpatient Medications  Medication Sig Dispense Refill   acetaminophen (TYLENOL) 500 MG tablet Take 2 tablets (1,000 mg total) by mouth every 6 (six) hours. 30 tablet 0   ibuprofen (ADVIL) 600 MG tablet Take 1 tablet (600 mg total) by mouth every 6 (six) hours. 30 tablet 0   montelukast (SINGULAIR) 10 MG tablet Take 1 tablet (10 mg total) by mouth at bedtime. 90 tablet 0   SYNTHROID 200 MCG tablet Take 200-300 mcg by mouth daily. Taking daily except on Sunday taking     No current facility-administered medications for this visit.   Known  Allergies:  Allergies  Allergen Reactions   Nickel Rash and Hives   Other Rash    Nickle    Past Surgical History: Past Surgical History:  Procedure Laterality Date   CHOLECYSTECTOMY  05/29/2017   patient reports   CYSTECTOMY Left    left wrist ganglion cyst   THYROIDECTOMY  08/20/2012   WISDOM TOOTH EXTRACTION     Family History: Family History  Problem Relation Age of Onset    Stroke Mother    Esophageal cancer Father        heavy smoker and drink   Tremor Brother    Drug abuse Brother    CVA Maternal Grandmother    Alzheimer's disease Maternal Grandfather    Alzheimer's disease Paternal Grandmother    CAD Paternal Grandfather    Allergic rhinitis Neg Hx    Asthma Neg Hx    Eczema Neg Hx    Urticaria Neg Hx    Social History: [name] lives in a home built in 1996, no water damage, hardwood floor in her bedroom, casein, central AC, indoor dog, no roaches, using dust mite covers on the bed and the pillows, not exposed to tobacco in the home or car.  She is not a smoker.  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  Not exposed to fumes chemicals or dust.  She is unsure if home has a HEPA filter..   ROS:  All other systems negative except as noted per HPI.  Objective:  Blood pressure 112/70, pulse 70, temperature 98 F (36.7 C), temperature source Temporal, resp. rate 16, height 5' 11.1" (1.806 m), weight 212 lb 14.4 oz (96.6 kg), SpO2 99%, unknown if currently breastfeeding. Body mass index is 29.61 kg/m. Physical Exam:  General Appearance:  Alert, cooperative, no distress, appears stated age  Head:  Normocephalic, without obvious abnormality, atraumatic  Eyes:  Conjunctiva clear, EOM's intact  Ears EACs normal bilaterally and normal TMs bilaterally  Nose: Nares normal,  septum slightly deviated, hypertrophic turbinates, normal mucosa, and no visible anterior polyps  Throat: Lips, tongue normal; teeth and gums normal, normal posterior oropharynx  Neck: Supple, symmetrical  Lungs:   clear to auscultation bilaterally, Respirations unlabored, no coughing  Heart:  regular rate and rhythm and no murmur, Appears well perfused  Extremities: No edema  Skin: Skin color, texture, turgor normal and no rashes or lesions on visualized portions of skin  Neurologic: No gross deficits   Diagnostics: Spirometry:  Tracings  reviewed. Her effort: Good reproducible efforts. FVC: 3.65L (pre), 3.54L  (post) FEV1: 3.08L, 81% predicted (pre), 3.14L, 83% predicted (post) FEV1/FVC ratio: 0.84 (pre), 0.89 (post) Interpretation:  Nonobstructive ratio, low FVC, borderline FEV1 .  No significant improvement in FEV1 or FVC Please see scanned spirometry results for details.  Labs:  Lab Orders  No laboratory test(s) ordered today     Assessment and Plan  Assessment and Plan    Chronic cough-history suggestive of asthma. Chronic cough, particularly after viral illnesses and during allergy seasons. Recent episode of dyspnea. Improvement with albuterol and oral prednisone. Borderline low pulmonary function test results. -Start Symbicort 80 as block therapy during symptomatic periods (two puffs twice a day).  Rinse mouth after use.  Use with spacer. -Continue albuterol 2 puffs every 4 hours as needed.  Can use 10 to 15 minutes prior to exercise if this causes symptoms. - Asthma is not controlled if:  - Symptoms are occurring >2 times a week OR  - >  2 times a month nighttime awakenings  - You are requiring systemic steroids (prednisone/steroid injections) more than once per year  - Your require hospitalization for your asthma.  - Please call the clinic to schedule a follow up if these symptoms arise Avoid smoke exposure Stay up-to-date with your annual flu vaccines, COVID vaccines and pneumonia vaccines when indicated.   Suspected allergic Rhinitis Chronic symptoms managed with Zyrtec, Singulair, and Flonase. Recent increase in eye redness. -Replace Flonase with Dymista (one spray each nostril twice a day as needed). -Consider Pataday or Zatidor eye drops for eye symptoms. -Continue Zyrtec 10 mg daily as needed -Continue Singulair 10 mg nightly -Stop Zyrtec and Dymista three days prior to allergy testing.  Eczema-flexural History of dry patches on elbow and leg, managed with moisturizers. -Continue current  management.     Follow up : March 4th, 2025 at 1:30 PM in [facility] office.  Stop Zyrtec and Dymista nasal spray 3 days prior to testing. It was a pleasure meeting you in clinic today! Thank you for allowing me to participate in your care.  Tonny Bollman, MD [facility]   This note in its entirety was forwarded to the Provider who requested this consultation.  Other:  spacer provided  Thank you for your kind referral. I appreciate the opportunity to take part in Forest's care. Please do not hesitate to contact me with questions.  Sincerely,  Tonny Bollman, MD [facility]

## 2023-10-23 ENCOUNTER — Encounter: Payer: Self-pay | Admitting: Internal Medicine

## 2023-10-23 ENCOUNTER — Ambulatory Visit: Payer: Managed Care, Other (non HMO) | Admitting: Internal Medicine

## 2023-10-23 VITALS — BP 112/70 | HR 70 | Temp 98.0°F | Resp 16 | Ht 71.1 in | Wt 212.9 lb

## 2023-10-23 DIAGNOSIS — R053 Chronic cough: Secondary | ICD-10-CM

## 2023-10-23 DIAGNOSIS — L2082 Flexural eczema: Secondary | ICD-10-CM | POA: Diagnosis not present

## 2023-10-23 DIAGNOSIS — J310 Chronic rhinitis: Secondary | ICD-10-CM | POA: Diagnosis not present

## 2023-10-23 DIAGNOSIS — L309 Dermatitis, unspecified: Secondary | ICD-10-CM | POA: Insufficient documentation

## 2023-10-23 MED ORDER — ALBUTEROL SULFATE HFA 108 (90 BASE) MCG/ACT IN AERS: 2.0000 | INHALATION_SPRAY | RESPIRATORY_TRACT | 2 refills | Status: DC | PRN

## 2023-10-23 MED ORDER — BUDESONIDE-FORMOTEROL FUMARATE 80-4.5 MCG/ACT IN AERO: INHALATION_SPRAY | RESPIRATORY_TRACT | 3 refills | Status: DC

## 2023-10-23 MED ORDER — AZELASTINE-FLUTICASONE 137-50 MCG/ACT NA SUSP: 1.0000 | Freq: Two times a day (BID) | NASAL | 5 refills | Status: DC | PRN

## 2023-10-23 MED ORDER — MONTELUKAST SODIUM 10 MG PO TABS: 10.0000 mg | ORAL_TABLET | Freq: Every day | ORAL | 1 refills | Status: DC

## 2023-10-23 NOTE — Patient Instructions (Signed)
 Chronic cough-history suggestive of asthma. Chronic cough, particularly after viral illnesses and during allergy seasons. Recent episode of dyspnea. Improvement with albuterol and oral prednisone. Borderline low pulmonary function test results. -Start Symbicort 80 as block therapy during symptomatic periods (two puffs twice a day).  Rinse mouth after use.  Use with spacer. -Continue albuterol 2 puffs every 4 hours as needed.  Can use 10 to 15 minutes prior to exercise if this causes symptoms. - Asthma is not controlled if:  - Symptoms are occurring >2 times a week OR  - >2 times a month nighttime awakenings  - You are requiring systemic steroids (prednisone/steroid injections) more than once per year  - Your require hospitalization for your asthma.  - Please call the clinic to schedule a follow up if these symptoms arise Avoid smoke exposure Stay up-to-date with your annual flu vaccines, COVID vaccines and pneumonia vaccines when indicated.   Suspected allergic Rhinitis Chronic symptoms managed with Zyrtec, Singulair, and Flonase. Recent increase in eye redness. -Replace Flonase with Dymista (one spray each nostril twice a day as needed). -Consider Pataday or Zatidor eye drops for eye symptoms. -Continue Zyrtec 10 mg daily as needed -Continue Singulair 10 mg nightly -Stop Zyrtec and Dymista three days prior to allergy testing.  Eczema History of dry patches on elbow and leg, managed with moisturizers. -Continue current management.     Follow up : March 4th, 2025 at 1:30 PM in [facility] office.  Stop Zyrtec and Dymista nasal spray 3 days prior to testing. It was a pleasure meeting you in clinic today! Thank you for allowing me to participate in your care.  Tonny Bollman, MD [facility]

## 2023-10-30 NOTE — Progress Notes (Unsigned)
 Date of Service/Encounter:  10/31/23  Allergy testing appointment   Initial visit on 10/23/23, seen for chronic cough concerning for asthma and chronic rhinitis, concerning for allergic rhinitis.  Please see that note for additional details.  Today reports for allergy diagnostic testing:    DIAGNOSTICS:  Skin Testing: Environmental allergy panel. Adequate positive and negative controls. Results discussed with patient/family.  Airborne Adult Perc - 10/31/23 1335     Time Antigen Placed 0140    Allergen Manufacturer Waynette Buttery    Location Back    Number of Test 55    1. Control-Buffer 50% Glycerol Negative    2. Control-Histamine 3+    3. Bahia Negative    4. French Southern Territ[place] Negative    5. Johnson Negative    6. Kentucky Blue Negative    7. Meadow Fescue Negative    8. Perennial Rye 2+    9. Timothy Negative    10. Ragweed Mix Negative    11. Cocklebur Negative    12. Plantain,  English Negative    13. Baccharis Negative    14. Dog Fennel Negative    15. Russian Thistle Negative    16. Lamb's Quarters Negative    17. Sheep Sorrell Negative    18. Rough Pigweed 2+    19. Marsh Elder, Rough Negative    20. Mugwort, Common Negative    21. Box, Elder Negative    22. Cedar, red 2+    23. Sweet Gum Negative    24. Pecan Pollen Negative    25. Pine Mix Negative    26. Walnut, Black Pollen Negative    27. Red Mulberry Negative    28. Ash Mix Negative    29. Birch Mix Negative    30. Beech American Negative    31. Cottonwood, Guinea-Bissau Negative    32. Hickory, White Negative    33. Maple Mix Negative    34. Oak, Guinea-[place]iss[place] Mix Negative    35. Sycamore Eastern 2+    36. Alternaria Alternata 2+    37. Cladosporium Herbarum Negative    38. Aspergillus Mix 2+    39. Penicillium Mix Negative    40. Bipolaris Sorokiniana (Helminthosporium) Negative    41. Drechslera Spicifera (Curvularia) Negative    42. Mucor Plumbeus Negative    43. Fusarium Moniliforme Negative    44. Aureobasidium  Pullulans (pullulara) Negative    45. Rhizopus Oryzae Negative    46. Botrytis Cinera Negative    47. Epicoccum Nigrum Negative    48. Phoma Betae Negative    49. Dust Mite Mix Negative    50. Cat Hair 10,000 BAU/ml Negative    51.  Dog Epithelia Negative    52. Mixed Feathers Negative    53. Horse Epithelia 2+    54. Cockroach, German 2+    55. Tobacco Leaf Negative             Intradermal - 10/31/23 1435     Time Antigen Placed 0220    Location Arm    Number of Test 10    Control 3+    Bahia 3+    [place] 3+    Johnson 3+    Ragweed Mix 2+    Mold 3 3+    Mold 4 3+    Cat 3+    Dog 3+    Cockroach 3+             Allergy testing results were read and interpreted by myself, documented by clinical  staff.  Patient provided with copy of allergy testing along with avoidance measures when indicated.   Tonny Bollman, MD  [facility]

## 2023-10-31 ENCOUNTER — Encounter: Payer: Self-pay | Admitting: Internal Medicine

## 2023-10-31 ENCOUNTER — Ambulatory Visit: Payer: Managed Care, Other (non HMO) | Admitting: Internal Medicine

## 2023-10-31 DIAGNOSIS — J302 Other seasonal allergic rhinitis: Secondary | ICD-10-CM | POA: Diagnosis not present

## 2023-10-31 DIAGNOSIS — J3089 Other allergic rhinitis: Secondary | ICD-10-CM

## 2023-10-31 NOTE — Patient Instructions (Signed)
 Chronic cough-history suggestive of asthma. Chronic cough, particularly after viral illnesses and during allergy seasons. Recent episode of dyspnea. Improvement with albuterol and oral prednisone. Borderline low pulmonary function test results. -Start Symbicort 80 as block therapy during symptomatic periods (two puffs twice a day).  Rinse mouth after use.  Use with spacer. -Continue albuterol 2 puffs every 4 hours as needed.  Can use 10 to 15 minutes prior to exercise if this causes symptoms. - Asthma is not controlled if:  - Symptoms are occurring >2 times a week OR  - >2 times a month nighttime awakenings  - You are requiring systemic steroids (prednisone/steroid injections) more than once per year  - Your require hospitalization for your asthma.  - Please call the clinic to schedule a follow up if these symptoms arise Avoid smoke exposure Stay up-to-date with your annual flu vaccines, COVID vaccines and pneumonia vaccines when indicated.  Seasonal and perennial allergic rhinitis Chronic symptoms managed with Zyrtec, Singulair, and Flonase. Recent increase in eye redness. -Replace Flonase with Dymista (one spray each nostril twice a day as needed). -Consider Pataday or Zatidor eye drops for eye symptoms. -Continue Zyrtec 10 mg daily as needed -Continue Singulair 10 mg nightly -Allergy testing today positive to rye grass, pigweed, red cedar and sycamore trees, Alternaria-outdoor mold, Aspergillus-indoor mold, horse and cockroach  IDs positive to grass pollen, ragweed, mold mix 3 and 4, dust mites, cat, dog Allergen avoidance. -Consider allergy injections to reduce lifetime symptoms and need for medications by teaching your immune system to become tolerant of the environmental allergens you are allergic to   Eczema History of dry patches on elbow and leg, managed with moisturizers. -Continue current management.     Follow up : 3 months, sooner for allergy injections.  It was a pleasure  seeing you again in clinic today! Thank you for allowing me to participate in your care.  Tonny Bollman, MD [facility]  DUST MITE AVOIDANCE MEASURES:  There are three main measures that need and can be taken to avoid house dust mites:  Reduce accumulation of dust in general -reduce furniture, clothing, carpeting, books, stuffed animals, especially in bedroom  Separate yourself from the dust -use pillow and mattress encasements (can be found at stores such as [employer] or online) -avoid direct exposure to air condition flow -use a HEPA filter device, especially in the bedroom; you can also use a HEPA filter vacuum cleaner -wipe dust with a moist towel instead of a dry towel or broom when cleaning  Decrease mites and/or their secretions -wash clothing and linen and stuffed animals at highest temperature possible, at least every 2 weeks -stuffed animals can also be placed in a bag and put in a freezer overnight  Despite the above measures, it is impossible to eliminate dust mites or their allergen completely from your home.  With the above measures the burden of mites in your home can be diminished, with the goal of minimizing your allergic symptoms.  Success will be reached only when implementing and using all means together. Control of Dog or Cat Allergen  Avoidance is the best way to manage a dog or cat allergy. If you have a dog or cat and are allergic to dog or cats, consider removing the dog or cat from the home. If you have a dog or cat but don't want to find it a new home, or if your family wants a pet even though someone in the  household is allergic, here are some strategies that may help keep symptoms at bay:  Keep the pet out of your bedroom and restrict it to only a few rooms. Be advised that keeping the dog or cat in only one room will not limit the allergens to that room. Don't pet, hug or kiss the dog or cat; if you do, wash your hands with  soap and water. High-efficiency particulate air (HEPA) cleaners run continuously in a bedroom or living room can reduce allergen levels over time. Regular use of a high-efficiency vacuum cleaner or a central vacuum can reduce allergen levels. Giving your dog or cat a bath at least once a week can reduce airborne allergen. Reducing Pollen Exposure  The American Academy of Allergy, Asthma and Immunology suggests the following steps to reduce your exposure to pollen during allergy seasons.    Do not hang sheets or clothing out to dry; pollen may collect on these items. Do not mow lawns or spend time around freshly cut grass; mowing stirs up pollen. Keep windows closed at night.  Keep car windows closed while driving. Minimize morning activities outdoors, a time when pollen counts are usually at their highest. Stay indoors as much as possible when pollen counts or humidity is high and on windy days when pollen tends to remain in the air longer. Use air conditioning when possible.  Many air conditioners have filters that trap the pollen spores. Use a HEPA room air filter to remove pollen form the indoor air you breathe. Control of Mold Allergen   Mold and fungi can grow on a variety of surfaces provided certain temperature and moisture conditions exist.  Outdoor molds grow on plants, decaying vegetation and soil.  The major outdoor mold, Alternaria and Cladosporium, are found in very high numbers during hot and dry conditions.  Generally, a late Summer - Fall peak is seen for common outdoor fungal spores.  Rain will temporarily lower outdoor mold spore count, but counts rise rapidly when the rainy period ends.  The most important indoor molds are Aspergillus and Penicillium.  Dark, humid and poorly ventilated basements are ideal sites for mold growth.  The next most common sites of mold growth are the bathroom and the kitchen.  Outdoor (Seasonal) Mold Control  Use air conditioning and keep windows  closed Avoid exposure to decaying vegetation. Avoid leaf raking. Avoid grain handling. Consider wearing a face mask if working in moldy areas.    Indoor (Perennial) Mold Control   Maintain humidity below 50%. Clean washable surfaces with 5% bleach solution. Remove sources e.g. contaminated carpets.

## 2023-11-27 ENCOUNTER — Encounter: Payer: Self-pay | Admitting: Internal Medicine

## 2023-12-09 ENCOUNTER — Encounter: Payer: Self-pay | Admitting: Internal Medicine

## 2023-12-11 ENCOUNTER — Other Ambulatory Visit: Payer: Self-pay

## 2023-12-11 MED ORDER — BUDESONIDE-FORMOTEROL FUMARATE 80-4.5 MCG/ACT IN AERO: INHALATION_SPRAY | RESPIRATORY_TRACT | 1 refills | Status: DC

## 2023-12-11 NOTE — Telephone Encounter (Signed)
 Okay

## 2023-12-11 NOTE — Telephone Encounter (Signed)
 Yes please start and continue until after your allergy injections are started.

## 2023-12-12 ENCOUNTER — Other Ambulatory Visit: Payer: Self-pay

## 2023-12-12 MED ORDER — BUDESONIDE-FORMOTEROL FUMARATE 80-4.5 MCG/ACT IN AERO: INHALATION_SPRAY | RESPIRATORY_TRACT | 1 refills | Status: DC

## 2023-12-12 NOTE — Telephone Encounter (Signed)
 Received fax request needing a new Rx for spacer and Symbicort.

## 2023-12-19 ENCOUNTER — Encounter: Payer: Self-pay | Admitting: Family

## 2023-12-19 MED ORDER — SCOPOLAMINE 1 MG/3DAYS TD PT72: 1.0000 | MEDICATED_PATCH | TRANSDERMAL | 0 refills | Status: DC

## 2023-12-22 ENCOUNTER — Ambulatory Visit: Admitting: Family

## 2023-12-22 VITALS — BP 97/58 | HR 58 | Temp 98.8°F | Resp 16 | Ht 71.1 in | Wt 204.0 lb

## 2023-12-22 DIAGNOSIS — A084 Viral intestinal infection, unspecified: Secondary | ICD-10-CM | POA: Insufficient documentation

## 2023-12-22 NOTE — Progress Notes (Signed)
 Subjective:     Patient ID: Debbie Patel, female    DOB: Oct 16, 1985, 38 y.o.   MRN: [id number]  Chief Complaint  Patient presents with   Diarrhea    Patient reports having diarrhea x 3 days, it stopped yesterday afternoon   Fever    Reports temperature of 100 on Tuesday    HPI  Discussed the use of AI scribe software for clinical note transcription with the patient, who gave verbal consent to proceed.  History of Present Illness   On Tuesday morning, she felt unwell after her workout, developing a low-grade fever of 100F. Later that afternoon, she experienced diarrhea, which persisted until last night. The diarrhea was frequent but not urgent. She took Advil  for the fever, which did not recur, and began Imodium on Tuesday evening, which eventually resolved the diarrhea by last night.  Her appetite is reduced, and she does not finish her meals. She suspects a half-eaten burger from [employer] on Monday night, which she found unappetizing, might have caused her symptoms. She has been diligent about hygiene, using one bathroom and washing her hands frequently to prevent spreading the illness to her family.  She has a history of low blood pressure, possibly due to dehydration, as she experienced a headache yesterday that improved with hydration and electrolyte tablets. She reports no unusual abdominal pain, only general tenderness. There is a family history of diverticulitis, but she does not experience similar pain.  She has not taken Imodium today and has not had a bowel movement since last night. She is monitoring for any recurrent fever or abdominal pain. No current fever, abdominal pain, or vomiting. Reports low blood pressure, headache, and general abdominal tenderness.    Health Maintenance Due  Topic Date Due   Hepatitis C Screening  Never done   HPV VACCINES (2 - 3-dose series) 02/24/2006   Cervical Cancer Screening (HPV/Pap Cotest)  Never done   COVID-19 Vaccine (5 -  2024-25 season) 07/25/2023    Past Medical History:  Diagnosis Date   Eczema    Eustachian tube dysfunction    Hashimoto's thyroiditis    Hypothyroidism    Nontoxic multinodular goiter    Urticaria     Past Surgical History:  Procedure Laterality Date   CHOLECYSTECTOMY  05/29/2017   patient reports   CYSTECTOMY Left    left wrist ganglion cyst   THYROIDECTOMY  08/20/2012   WISDOM TOOTH EXTRACTION      Family History  Problem Relation Age of Onset   Stroke Mother    Esophageal cancer Father        heavy smoker and drink   Tremor Brother    Drug abuse Brother    CVA Maternal Grandmother    Alzheimer's disease Maternal Grandfather    Alzheimer's disease Paternal Grandmother    CAD Paternal Grandfather    Allergic rhinitis Neg Hx    Asthma Neg Hx    Eczema Neg Hx    Urticaria Neg Hx     Social History   Socioeconomic History   Marital status: Single    Spouse name: Not on file   Number of children: Not on file   Years of education: Not on file   Highest education level: Not on file  Occupational History   Not on file  Tobacco Use   Smoking status: Never    Passive exposure: Never   Smokeless tobacco: Never  Vaping Use   Vaping status: Never Used  Substance and Sexual  Activity   Alcohol use: Yes    Comment: occasionally   Drug use: No   Sexual activity: Yes    Partners: Male    Birth control/protection: None, Surgical  Other Topics Concern   Not on file  Social History Narrative   Mom is Porfirio Bristol   Has 2 children born 2023 (daughter)   2018 (son)   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   Married   Completed masters degree in Animal nutritionist   Enjoys spending time with friends, signs in church, former Horticulturist, commercial   Social Drivers of Corporate investment banker Strain: Not on file  Food Insecurity: Low Risk  (05/23/2023)   Received from [facility]   Hunger Vital Sign    Worried About Running Out of Food in the Last  Year: Never true    Ran Out of Food in the Last Year: Never true  Transportation Needs: No Transportation Needs (05/23/2023)   Received from [facility]    In the past 12 months, has lack of reliable transportation kept you from medical appointments, meetings, work or from getting things needed for daily living? : No  Physical Activity: Not on file  Stress: Not on file  Social Connections: Unknown (07/09/2022)   [facility]   Social Network    Social Network: Not on file  Intimate Partner Violence: Unknown (07/09/2022)   Received from [facility], [facility]   HITS    Physically Hurt: Not on file    Insult or Talk Down To: Not on file    Threaten Physical Harm: Not on file    Scream or Curse: Not on file    Outpatient Medications Prior to Visit  Medication Sig Dispense Refill   acetaminophen  (TYLENOL ) 500 MG tablet Take 2 tablets (1,000 mg total) by mouth every 6 (six) hours. 30 tablet 0   albuterol  (VENTOLIN  HFA) 108 (90 Base) MCG/ACT inhaler Inhale 2 puffs into the lungs every 4 (four) hours as needed for wheezing or shortness of breath. 8 g 2   Azelastine -Fluticasone  (DYMISTA ) 137-50 MCG/ACT SUSP Place 1 spray into both nostrils 2 (two) times daily as needed. 23 g 5   budesonide -formoterol  (SYMBICORT ) 80-4.5 MCG/ACT inhaler At onset of respiratory illness/asthma flare: Inhale 2 puffs twice daily with spacer for 2 weeks or until symptoms resolve. 30.6 each 1   ibuprofen  (ADVIL ) 600 MG tablet Take 1 tablet (600 mg total) by mouth every 6 (six) hours. 30 tablet 0   montelukast  (SINGULAIR ) 10 MG tablet Take 1 tablet (10 mg total) by mouth at bedtime. 90 tablet 1   scopolamine  (TRANSDERM-SCOP) 1 MG/3DAYS Place 1 patch (1.5 mg total) onto the skin every 3 (three) days. 3 patch 0   SYNTHROID  200 MCG tablet Take 200-300 mcg by mouth daily. Taking 200mcg daily except on Sunday taking 300mcg     No facility-administered medications prior  to visit.    Allergies  Allergen Reactions   Nickel Rash and Hives   Other Rash    Nickle     ROS See HPI    Objective:    Physical Exam Constitutional:      General: She is not in acute distress.    Appearance: Normal appearance. She is well-developed.  HENT:     Head: Normocephalic and atraumatic.     Right Ear: External ear normal.     Left Ear: External ear normal.  Eyes:  General: No scleral icterus. Neck:     Thyroid : No thyromegaly.  Cardiovascular:     Rate and Rhythm: Normal rate and regular rhythm.     Heart sounds: Normal heart sounds. No murmur heard. Pulmonary:     Effort: Pulmonary effort is normal. No respiratory distress.     Breath sounds: Normal breath sounds. No wheezing.  Abdominal:     Comments: Generalized abdominal tenderness without guarding  Musculoskeletal:     Cervical back: Neck supple.  Skin:    General: Skin is warm and dry.  Neurological:     Mental Status: She is alert and oriented to person, place, and time.  Psychiatric:        Mood and Affect: Mood normal.        Behavior: Behavior normal.        Thought Content: Thought content normal.        Judgment: Judgment normal.      BP (!) 97/58 (BP Location: Right Arm, Patient Position: Sitting, Cuff Size: Large)   Pulse (!) 58   Temp 98.8 F (37.1 C) (Oral)   Resp 16   Ht 5' 11.1" (1.806 m)   Wt 204 lb (92.5 kg)   SpO2 100%   BMI 28.37 kg/m  Wt Readings from Last 3 Encounters:  12/22/23 204 lb (92.5 kg)  10/23/23 212 lb 14.4 oz (96.6 kg)  10/11/23 211 lb (95.7 kg)       Assessment & Plan:   Problem List Items Addressed This Visit       Unprioritized   Viral gastroenteritis - Primary   Sounds like it is resolving. BP is a bit soft- encouraged hydration.  She is advised to call if symptoms worsen or if symptoms do not continue to improve.        I am having Suhani Stillion "KATIE" maintain her Synthroid , acetaminophen , ibuprofen , Azelastine -Fluticasone ,  albuterol , montelukast , budesonide -formoterol , and scopolamine .  No orders of the defined types were placed in this encounter.

## 2023-12-22 NOTE — Assessment & Plan Note (Signed)
 Sounds like it is resolving. BP is a bit soft- encouraged hydration.  She is advised to call if symptoms worsen or if symptoms do not continue to improve.

## 2023-12-25 ENCOUNTER — Ambulatory Visit: Payer: Self-pay

## 2023-12-25 ENCOUNTER — Ambulatory Visit: Admitting: Family Medicine

## 2023-12-25 ENCOUNTER — Encounter: Payer: Self-pay | Admitting: Family Medicine

## 2023-12-25 VITALS — BP 118/70 | HR 80 | Temp 97.7°F | Ht 71.1 in | Wt 206.8 lb

## 2023-12-25 DIAGNOSIS — J020 Streptococcal pharyngitis: Secondary | ICD-10-CM

## 2023-12-25 DIAGNOSIS — J029 Acute pharyngitis, unspecified: Secondary | ICD-10-CM

## 2023-12-25 LAB — POCT RAPID STREP A (OFFICE): Rapid Strep A Screen: POSITIVE — AB

## 2023-12-25 MED ORDER — FLUCONAZOLE 150 MG PO TABS: ORAL_TABLET | ORAL | 0 refills | Status: DC

## 2023-12-25 MED ORDER — AMOXICILLIN 500 MG PO CAPS: 500.0000 mg | ORAL_CAPSULE | Freq: Three times a day (TID) | ORAL | 0 refills | Status: AC

## 2023-12-25 NOTE — Telephone Encounter (Signed)
 Pt has appt today 12/25/23 at 11:00 am with Dr Rodrick Clapper.

## 2023-12-25 NOTE — Telephone Encounter (Signed)
 Okay for Rx fr yeast? Okay to start with 3 of the antibiotic?

## 2023-12-25 NOTE — Telephone Encounter (Signed)
 Chief Complaint: Sore Throat Symptoms: hoarseness, nasal congestion Frequency: x 3 days Pertinent Negatives: Patient denies fever, SOB Disposition: [] ED /[] Urgent Care (no appt availability in office) / [x] Appointment(In office/virtual)/ []  [place]/ [] Home Care/ [] Refused Recommended Disposition /[] [facility]/ []  Follow-up with PCP Additional Notes: Pt reports she began with ST, painful speaking, hoarseness, nasal congestion, productive cough with green sputum. Pt denies fever, SOB. OV scheduled. This RN educated pt on home care, new-worsening symptoms, when to call back/seek emergent care. Pt verbalized understanding and agrees to plan.    Copied from CRM [id number]. Topic: Clinical - Red Word Triage >> Dec 25, 2023 10:22 AM Elle L wrote: Red Word that prompted transfer to Nurse Triage: The patient was in on 4/25 due to being sick. However, now it is worsening as the patient has lost her voice and it is painful to talk. Reason for Disposition  Earache also present  Answer Assessment - Initial Assessment Questions 1. ONSET: "When did the throat start hurting?" (Hours or days ago)      Saturday 2. SEVERITY: "How bad is the sore throat?" (Scale 1-10; mild, moderate or severe)   - MILD (1-3):  Doesn't interfere with eating or normal activities.   - MODERATE (4-7): Interferes with eating some solids and normal activities.   - SEVERE (8-10):  Excruciating pain, interferes with most normal activities.   - SEVERE WITH DYSPHAGIA (10): Can't swallow liquids, drooling.     Hurts to talk, hoarseness 3. STREP EXPOSURE: "Has there been any exposure to strep within the past week?" If Yes, ask: "What type of contact occurred?"      None 4.  VIRAL SYMPTOMS: "Are there any symptoms of a cold, such as a runny nose, cough, hoarse voice or red eyes?"      Runny nose, nasal congestion, green mucous 5. FEVER: "Do you have a fever?" If Yes, ask: "What is your temperature, how was it  measured, and when did it start?"     None  7. OTHER SYMPTOMS: "Do you have any other symptoms?" (e.g., difficulty breathing, headache, rash)     HA, "asthma" related  Protocols used: Sore Throat-A-AH

## 2023-12-25 NOTE — Progress Notes (Signed)
 [facility] [address], [address] [place], [place] [address] [phone]  Date:  12/25/2023   Name:  Debbie Patel   DOB:  Jul 15, 1986   MRN:  [id number]  PCP:  Dorrene Gaucher, NP    Chief Complaint: Acute Visit (Patient presents today for left ear pain and voice changing.)   History of Present Illness:  Debbie Patel is a 38 y.o. very pleasant female patient who presents with the following:  Generally healthy woman seen today with concern of sore throat.  She noted onset of sore throat about 2 days ago, she also has a hoarse voice.  Otherwise she is feeling okay, no fever noted.  She has 2 children ages 2 and 76, they are not currently sick.  Patient notes she did have gastroenteritis last week but this is now cleared up  Patient Active Problem List   Diagnosis Date Noted   Viral gastroenteritis 12/22/2023   Seasonal and perennial allergic rhinitis 10/31/2023   Eczema 10/23/2023   Cough 10/11/2023   Preventative health care 06/21/2023   Seasonal allergies 06/21/2023   Musculoskeletal pain 06/21/2023   Rh negative, maternal / newborn Rh negative 09/02/2021   Encounter for induction of labor 09/01/2021   History of Hashimoto thyroiditis 10/31/2019   Hypothyroidism 12/03/2016   Fracture of left foot 10/17/2012    Past Medical History:  Diagnosis Date   Eczema    Eustachian tube dysfunction    Hashimoto's thyroiditis    Hypothyroidism    Nontoxic multinodular goiter    Urticaria     Past Surgical History:  Procedure Laterality Date   CHOLECYSTECTOMY  05/29/2017   patient reports   CYSTECTOMY Left    left wrist ganglion cyst   THYROIDECTOMY  08/20/2012   WISDOM TOOTH EXTRACTION      Social History   Tobacco Use   Smoking status: Never    Passive exposure: Never   Smokeless tobacco: Never  Vaping Use   Vaping status: Never Used  Substance Use Topics   Alcohol use: Yes    Comment: occasionally    Drug use: No    Family History  Problem Relation Age of Onset   Stroke Mother    Esophageal cancer Father        heavy smoker and drink   Tremor Brother    Drug abuse Brother    CVA Maternal Grandmother    Alzheimer's disease Maternal Grandfather    Alzheimer's disease Paternal Grandmother    CAD Paternal Grandfather    Allergic rhinitis Neg Hx    Asthma Neg Hx    Eczema Neg Hx    Urticaria Neg Hx     Allergies  Allergen Reactions   Nickel Rash and Hives   Other Rash    Nickle     Medication list has been reviewed and updated.  Current Outpatient Medications on File Prior to Visit  Medication Sig Dispense Refill   acetaminophen  (TYLENOL ) 500 MG tablet Take 2 tablets (1,000 mg total) by mouth every 6 (six) hours. 30 tablet 0   albuterol  (VENTOLIN  HFA) 108 (90 Base) MCG/ACT inhaler Inhale 2 puffs into the lungs every 4 (four) hours as needed for wheezing or shortness of breath. 8 g 2   Azelastine -Fluticasone  (DYMISTA ) 137-50 MCG/ACT SUSP Place 1 spray into both nostrils 2 (two) times daily as needed. 23 g 5   budesonide -formoterol  (SYMBICORT ) 80-4.5 MCG/ACT inhaler At onset of respiratory illness/asthma flare: Inhale 2  puffs twice daily with spacer for 2 weeks or until symptoms resolve. 30.6 each 1   ibuprofen  (ADVIL ) 600 MG tablet Take 1 tablet (600 mg total) by mouth every 6 (six) hours. 30 tablet 0   montelukast  (SINGULAIR ) 10 MG tablet Take 1 tablet (10 mg total) by mouth at bedtime. 90 tablet 1   scopolamine  (TRANSDERM-SCOP) 1 MG/3DAYS Place 1 patch (1.5 mg total) onto the skin every 3 (three) days. 3 patch 0   SYNTHROID  200 MCG tablet Take 200-300 mcg by mouth daily. Taking 200mcg daily except on Sunday taking 300mcg     No current facility-administered medications on file prior to visit.    Review of Systems:  As per HPI- otherwise negative.   Physical Examination: Vitals:   12/25/23 1106  BP: 118/70  Pulse: 80  Temp: 97.7 F (36.5 C)  SpO2: 98%   Vitals:    12/25/23 1106  Weight: 206 lb 12.8 oz (93.8 kg)  Height: 5' 11.1" (1.806 m)   Body mass index is 28.76 kg/m. Ideal Body Weight: Weight in (lb) to have BMI = 25: 179.4  GEN: No acute distress; alert,appropriate.  Normal weight, looks well PULM: Breathing comfortably in no respiratory distress Cardiac exam: Regular rate and rhythm, no murmurs rubs or gallops PSYCH: Normally interactive.  Bilateral TM wnl, oropharynx is erythematous with exudate, no sign of abscess.  PEERL,EOMI.     Results for orders placed or performed in visit on 12/25/23  POCT rapid strep A   Collection Time: 12/25/23 11:50 AM  Result Value Ref Range   Rapid Strep A Screen Positive (A) Negative    Assessment and Plan: Strep pharyngitis  Sore throat - Plan: POCT rapid strep A  Patient seen today with strep pharyngitis.  We will start her on amoxicillin which is sent to her drugstore I counseled her that she is most contagious for the next 24 hours She will watch her kids for any symptoms of strep throat.  Recommend a new toothbrush after 24 hours of antibiotics She will let us  know if not getting better in the next 1 to 2 days  Meds ordered this encounter  Medications   amoxicillin (AMOXIL) 500 MG capsule    Sig: Take 1 capsule (500 mg total) by mouth 3 (three) times daily for 10 days.    Dispense:  30 capsule    Refill:  0     Signed Gates Kasal, MD

## 2023-12-26 ENCOUNTER — Encounter: Payer: Self-pay | Admitting: Internal Medicine

## 2023-12-26 ENCOUNTER — Telehealth: Payer: Self-pay

## 2023-12-26 ENCOUNTER — Other Ambulatory Visit (HOSPITAL_COMMUNITY): Payer: Self-pay

## 2023-12-26 MED ORDER — ALBUTEROL SULFATE HFA 108 (90 BASE) MCG/ACT IN AERS: 2.0000 | INHALATION_SPRAY | RESPIRATORY_TRACT | 2 refills | Status: DC | PRN

## 2023-12-26 NOTE — Telephone Encounter (Signed)
 Resent

## 2023-12-26 NOTE — Telephone Encounter (Signed)
*  Asthma/Allergy   Pharmacy Patient Advocate Encounter   Received notification from CoverMyMeds that prior authorization for Albuterol  Sulfate HFA 108 (90 Base)MCG/ACT aerosol  is required/requested.   Insurance verification completed.   The patient is insured through [employer] .   Per test claim:  Albuterol  HFA 8.5gm is preferred by the insurance.  If suggested medication is appropriate, Please send in a new RX and discontinue this one. If not, please advise as to why it's not appropriate so that we may request a Prior Authorization. Please note, some preferred medications may still require a PA.  If the suggested medications have not been trialed and there are no contraindications to their use, the PA will not be submitted, as it will not be approved.   CMM Key: [id number]

## 2023-12-28 ENCOUNTER — Telehealth: Payer: Self-pay

## 2023-12-28 ENCOUNTER — Other Ambulatory Visit (HOSPITAL_COMMUNITY): Payer: Self-pay

## 2023-12-28 ENCOUNTER — Other Ambulatory Visit: Payer: Self-pay | Admitting: Internal Medicine

## 2023-12-28 DIAGNOSIS — J302 Other seasonal allergic rhinitis: Secondary | ICD-10-CM

## 2023-12-28 MED ORDER — EPINEPHRINE 0.3 MG/0.3ML IJ SOAJ: 0.3000 mg | INTRAMUSCULAR | 2 refills | Status: AC | PRN

## 2023-12-28 NOTE — Progress Notes (Signed)
RUSH AIT Rx.

## 2023-12-28 NOTE — Telephone Encounter (Signed)
*  Asthma/Allergy  Pharmacy Patient Advocate Encounter   Received notification from CoverMyMeds that prior authorization for EPINEPHrine 0.3MG /0.3ML auto-injectors  is required/requested.   Insurance verification completed.   The patient is insured through [employer] .   Per test claim: The current 30 day co-pay is, $10.00.  No PA needed at this time. This test claim was processed through [facility]- copay amounts may vary at other pharmacies due to pharmacy/plan contracts, or as the patient moves through the different stages of their insurance plan.

## 2024-01-02 NOTE — Progress Notes (Signed)
 Aeroallergen Immunotherapy  Ordering Provider: Dr. Jonathon Neighbors  Patient Details Name: Debbie Patel MRN: [id number] Date of Birth: [birth year]  Order 2 of 2  Vial Label: M/DM/C/CR  0.2 ml (Volume)  1:20 Concentration -- Alternaria alternata 0.2 ml (Volume)  1:10 Concentration -- Aspergillus mix 0.2 ml (Volume)  1:20 Concentration -- Bipolaris sorokiniana 0.2 ml (Volume)  1:20 Concentration -- Drechslera spicifera 0.2 ml (Volume)  1:10 Concentration -- Mucor plumbeus 0.2 ml (Volume)  1:10 Concentration -- Fusarium moniliforme 0.2 ml (Volume)  1:40 Concentration -- Aureobasidium pullulans 0.2 ml (Volume)  1:10 Concentration -- Rhizopus oryzae 0.5 ml (Volume)  1:10 Concentration -- Cat Hair 0.3 ml (Volume)  1:20 Concentration -- Cockroach, German 0.5 ml (Volume)   AU Concentration -- Mite Mix (DF 5,000 & DP 5,000)   2.9  ml Extract Subtotal 2.1  ml Diluent  5.0  ml Maintenance Total  Schedule:  RUSH Silver Vial (1:1,000,000): RUSH Blue Vial (1:100,000): RUSH Yellow Vial (1:10,000): RUSH Green Vial (1:1,000): Schedule B (6 doses) Red Vial (1:100): Schedule A (14 doses)  Special Instructions: RUSH, green B, red A, then maintenance protocol After completion of the first Red Vial, please space to every two weeks. After completion of the second Red Vial, please space to every 4 weeks. Ok to up dose new vials at 0.90mL --> 0.3 mL --> 0.5 mL. Ok to come twice weekly in green vial, if desired, as long as there is 48 hours between injections.

## 2024-01-02 NOTE — Progress Notes (Signed)
MAKE VIALS CLOSER TO APPT.

## 2024-01-02 NOTE — Progress Notes (Signed)
 Aeroallergen Immunotherapy   Ordering Provider: Dr. Jonathon Neighbors   Patient Details  Name: Debbie Patel  MRN: [id number]  Date of Birth: [birth year]   Order 1 of 2   Vial Label: G/W/T/Dog/Horse   0.3 ml (Volume)  BAU Concentration -- 7 Grass Mix* 100,000 ([place]  Blue, [place], [place], Perennial Rye, RedTop, Sweet Vernal, Timothy)  0.2 ml (Volume)  1:20 Concentration -- [place]  0.3 ml (Volume)  BAU Concentration -- [place] 10,000  0.2 ml (Volume)  1:20 Concentration -- Johnson  0.3 ml (Volume)  1:20 Concentration -- Ragweed Mix  0.2 ml (Volume)  1:20 Concentration -- Rough Pigweed*  0.2 ml (Volume)  1:10 Concentration -- Cedar, red  0.2 ml (Volume)  1:10 Concentration -- Sycamore Eastern*  0.5 ml (Volume)  1:10 Concentration -- Dog Epithelia  0.3 ml (Volume)  1:10 Concentration -- Horse Epithelia    2.7  ml Extract Subtotal  2.3  ml Diluent   5.0  ml Maintenance Total   Schedule:  RUSH  Silver Vial (1:1,000,000): RUSH  Blue Vial (1:100,000): RUSH  Yellow Vial (1:10,000): RUSH  Green Vial (1:1,000): Schedule B (6 doses)  Red Vial (1:100): Schedule A (14 doses)   Special Instructions: RUSH, green B, red A, then maintenance protocol  After completion of the first Red Vial, please space to every two weeks. After completion of the second Red Vial, please space to every 4 weeks. Ok to up dose new vials at 0.44mL --> 0.3 mL --> 0.5 mL. Ok to come twice weekly in green vial, if desired, as long as there is 48 hours between injections.

## 2024-01-05 ENCOUNTER — Telehealth: Payer: Self-pay

## 2024-01-05 NOTE — Telephone Encounter (Signed)
 Keep receiving requests from patients pharmacy ([facility])  This medication has been changed to the preferred option (8.5gm) Called patient pharmacy and left message for them to process with correct version to get a paid claim.   Pharmacy Patient Advocate Encounter  Received notification from [employer] that Prior Authorization for Albuterol  Sulfate HFA 108 (90 Base)MCG/ACT aerosol  has been CANCELLED due to medication changed to preferred version    PA #/Case ID/Reference #: [id number]

## 2024-01-31 ENCOUNTER — Ambulatory Visit: Admitting: Internal Medicine

## 2024-02-08 DIAGNOSIS — J3081 Allergic rhinitis due to animal (cat) (dog) hair and dander: Secondary | ICD-10-CM | POA: Diagnosis not present

## 2024-02-08 NOTE — Progress Notes (Signed)
 VIALS MAKE ON 02/08/24

## 2024-02-09 ENCOUNTER — Ambulatory Visit: Admitting: Internal Medicine

## 2024-02-09 DIAGNOSIS — J3089 Other allergic rhinitis: Secondary | ICD-10-CM | POA: Diagnosis not present

## 2024-02-21 ENCOUNTER — Encounter: Payer: Self-pay | Admitting: Internal Medicine

## 2024-02-21 ENCOUNTER — Ambulatory Visit: Admitting: Internal Medicine

## 2024-02-21 VITALS — BP 108/64 | HR 72 | Temp 98.1°F | Resp 16 | Wt 209.5 lb

## 2024-02-21 DIAGNOSIS — J3089 Other allergic rhinitis: Secondary | ICD-10-CM | POA: Diagnosis not present

## 2024-02-21 DIAGNOSIS — J302 Other seasonal allergic rhinitis: Secondary | ICD-10-CM

## 2024-02-21 DIAGNOSIS — J4541 Moderate persistent asthma with (acute) exacerbation: Secondary | ICD-10-CM | POA: Diagnosis not present

## 2024-02-21 DIAGNOSIS — L308 Other specified dermatitis: Secondary | ICD-10-CM | POA: Diagnosis not present

## 2024-02-21 MED ORDER — BUDESONIDE-FORMOTEROL FUMARATE 160-4.5 MCG/ACT IN AERO: 2.0000 | INHALATION_SPRAY | Freq: Every day | RESPIRATORY_TRACT | 5 refills | Status: DC

## 2024-02-21 MED ORDER — AIRSUPRA 90-80 MCG/ACT IN AERO: 2.0000 | INHALATION_SPRAY | RESPIRATORY_TRACT | 2 refills | Status: DC | PRN

## 2024-02-21 MED ORDER — PREDNISONE 20 MG PO TABS: 40.0000 mg | ORAL_TABLET | Freq: Every day | ORAL | 0 refills | Status: AC

## 2024-02-21 MED ORDER — METHYLPREDNISOLONE ACETATE 40 MG/ML IJ SUSP
40.0000 mg | Freq: Once | INTRAMUSCULAR | Status: AC
  Administered 2024-02-21: 40 mg via INTRAMUSCULAR

## 2024-02-21 NOTE — Patient Instructions (Addendum)
 Mild Persistent Asthma with exacerbation:  FEV1 71%, expiratory wheezing throughout on exam. - 40 mg IM depo in clinic today - tomorrow start prednisone  40 mg daily for 4 days. Rescheduling RUSH Chronic cough, particularly after viral illnesses and during allergy  seasons. Recent episode of dyspnea. Improvement with albuterol  and oral prednisone . Borderline low pulmonary function test results. - Controller Inhaler: Start Symbicort  160 mcg 2 puffs twice a day; This Should Be Used Everyday. Will replace Symbicort  80. - Rinse mouth out after use -use with spacer. - During respiratory illness or asthma flares: Increase symbicort  160 mcg 3 puffs twice daily  and continue for 2 weeks or until symptoms resolve. - Rescue Inhaler: Airsupra 2 puffs. Use  every 4-6 hours as needed for chest tightness, wheezing, or coughing.   Can also use 15 minutes prior to exercise if you have symptoms with activity. - Asthma is not controlled if:  - Symptoms are occurring >2 times a week OR  - >2 times a month nighttime awakenings  - You are requiring systemic steroids (prednisone /steroid injections) more than once per year  - Your require hospitalization for your asthma.  - Please call the clinic to schedule a follow up if these symptoms arise Avoid smoke exposure Stay up-to-date with your annual flu vaccines, COVID vaccines and pneumonia vaccines when indicated.  Seasonal and perennial allergic rhinitis: Chronic symptoms managed with Zyrtec, Singulair , and Flonase. Recent increase in eye redness. - Dymista  (one spray each nostril twice a day as needed). -Consider Pataday or Zatidor eye drops for eye symptoms. -Continue Zyrtec 10 mg daily as needed -Continue Singulair  10 mg nightly -Allergy  testing 10/31/23 positive to rye grass, pigweed, red cedar and sycamore trees, Alternaria-outdoor mold, Aspergillus-indoor mold, horse and cockroach  IDs positive to grass pollen, ragweed, mold mix 3 and 4, dust mites, cat,  dog Allergen avoidance. Holding RUSH for now until asthma controlled  Eczema History of dry patches on elbow and leg, managed with moisturizers. -Continue current management.     Follow up : 4-6 weeks, sooner if needed. If doing well, will reschedule for RUSH. It was a pleasure seeing you again in clinic today! Thank you for allowing me to participate in your care.  Rocky Endow, MD [facility]  DUST MITE AVOIDANCE MEASURES:  There are three main measures that need and can be taken to avoid house dust mites:  Reduce accumulation of dust in general -reduce furniture, clothing, carpeting, books, stuffed animals, especially in bedroom  Separate yourself from the dust -use pillow and mattress encasements (can be found at stores such as [employer] or online) -avoid direct exposure to air condition flow -use a HEPA filter device, especially in the bedroom; you can also use a HEPA filter vacuum cleaner -wipe dust with a moist towel instead of a dry towel or broom when cleaning  Decrease mites and/or their secretions -wash clothing and linen and stuffed animals at highest temperature possible, at least every 2 weeks -stuffed animals can also be placed in a bag and put in a freezer overnight  Despite the above measures, it is impossible to eliminate dust mites or their allergen completely from your home.  With the above measures the burden of mites in your home can be diminished, with the goal of minimizing your allergic symptoms.  Success will be reached only when implementing and using all means together. Control of Dog or Cat Allergen  Avoidance is the best way to manage a dog  or cat allergy . If you have a dog or cat and are allergic to dog or cats, consider removing the dog or cat from the home. If you have a dog or cat but don't want to find it a new home, or if your family wants a pet even though someone in the household is allergic, here are some  strategies that may help keep symptoms at bay:  Keep the pet out of your bedroom and restrict it to only a few rooms. Be advised that keeping the dog or cat in only one room will not limit the allergens to that room. Don't pet, hug or kiss the dog or cat; if you do, wash your hands with soap and water. High-efficiency particulate air (HEPA) cleaners run continuously in a bedroom or living room can reduce allergen levels over time. Regular use of a high-efficiency vacuum cleaner or a central vacuum can reduce allergen levels. Giving your dog or cat a bath at least once a week can reduce airborne allergen. Reducing Pollen Exposure  The American Academy of Allergy , Asthma and Immunology suggests the following steps to reduce your exposure to pollen during allergy  seasons.    Do not hang sheets or clothing out to dry; pollen may collect on these items. Do not mow lawns or spend time around freshly cut grass; mowing stirs up pollen. Keep windows closed at night.  Keep car windows closed while driving. Minimize morning activities outdoors, a time when pollen counts are usually at their highest. Stay indoors as much as possible when pollen counts or humidity is high and on windy days when pollen tends to remain in the air longer. Use air conditioning when possible.  Many air conditioners have filters that trap the pollen spores. Use a HEPA room air filter to remove pollen form the indoor air you breathe. Control of Mold Allergen   Mold and fungi can grow on a variety of surfaces provided certain temperature and moisture conditions exist.  Outdoor molds grow on plants, decaying vegetation and soil.  The major outdoor mold, Alternaria and Cladosporium, are found in very high numbers during hot and dry conditions.  Generally, a late Summer - Fall peak is seen for common outdoor fungal spores.  Rain will temporarily lower outdoor mold spore count, but counts rise rapidly when the rainy period ends.  The  most important indoor molds are Aspergillus and Penicillium.  Dark, humid and poorly ventilated basements are ideal sites for mold growth.  The next most common sites of mold growth are the bathroom and the kitchen.  Outdoor (Seasonal) Mold Control  Use air conditioning and keep windows closed Avoid exposure to decaying vegetation. Avoid leaf raking. Avoid grain handling. Consider wearing a face mask if working in moldy areas.    Indoor (Perennial) Mold Control   Maintain humidity below 50%. Clean washable surfaces with 5% bleach solution. Remove sources e.g. contaminated carpets.

## 2024-02-21 NOTE — Progress Notes (Addendum)
 FOLLOW UP Date of Service/Encounter:   02/21/2024  Subjective:  Debbie Patel (DOB: [birth year]) is a 38 y.o. female who [facility] in re-evaluation of the following: Chronic cough suggestive of asthma, seasonal perennial allergic rhinitis History obtained from: chart review and patient.  For Review, LV was on 10/31/23  with Dr.Heiress Armentor seen for allergy  testing visit. See below for summary of history and diagnostics.   Therapeutic plans/changes recommended: She was scheduled for Rush immunotherapy to start February 27, 2024. ----------------------------------------------------- Pertinent History/Diagnostics:  Chronic cough-history suggestive of asthma. Chronic cough, particularly after viral illnesses and during allergy  seasons. Recent episode of dyspnea. Improvement with albuterol  and oral prednisone . Borderline low pulmonary function test results. -Plan:Symbicort  80 as block therapy Seasonal and perennial allergic rhinitis Chronic symptoms managed with Zyrtec, Singulair , and Flonase. Recent increase in eye redness. -Plan: Dymista  (one spray each nostril twice a day as needed), Pataday or Zatidor eye drops for eye symptoms., Zyrtec 10 mg daily as needed, Singulair  10 mg nightly -Allergy  testing 10/31/23 positive to rye grass, pigweed, red cedar and sycamore trees, Alternaria-outdoor mold, Aspergillus-indoor mold, horse and cockroach  IDs positive to grass pollen, ragweed, mold mix 3 and 4, dust mites, cat, dog --------------------------------------------------- Today presents for follow-up. Discussed the use of AI scribe software for clinical note transcription with the patient, who gave verbal consent to proceed.  History of Present Illness   Debbie Patel is a 38 year old female with asthma who presents with persistent cough and chest congestion following a recent cold.  She developed a cold approximately two weeks ago, likely  contracted from her two-year-old daughter. The cold primarily manifested as a deep and persistent cough without significant sinus involvement. She experiences occasional chest pain upon coughing, with symptoms initially worse at night but now improved. However, she still has intermittent chest congestion and discomfort when coughing.  Her current medication regimen includes consistent use of Symbicort  80 mcg 2 puffs twice daily, Zyrtec, a nasal spray, dymista  in the morning and at night, and Singulair  at night. Despite these medications, she continues to experience symptoms, particularly cough and chest congestion. She occasionally uses albuterol , which she finds helpful, but has not used it consistently.  In the past two years, she has experienced increased allergy  symptoms, requiring multiple medications. Prior to this period, she only occasionally needed Claritin. She has had two significant illnesses since starting her current treatment regimen: strep throat and the current cold.  No fever or other symptoms that would typically prompt a visit to the doctor. The cough has improved over time, but she can still 'hear it' when she coughs, and it occasionally causes pain. Her husband suggested she see a doctor last week due to the persistent cough, but she opted to wait for her scheduled appointment.       All medications reviewed by clinical staff and updated in chart. No new pertinent medical or surgical history except as noted in HPI.  ROS: All others negative except as noted per HPI.   Objective:  BP 108/64   Pulse 72   Temp 98.1 F (36.7 C) (Temporal)   Resp 16   Wt 209 lb 8 oz (95 kg)   SpO2 100%   BMI 29.14 kg/m  Body mass index is 29.14 kg/m. Physical Exam: General Appearance:  Alert, cooperative, no distress, appears stated age  Head:  Normocephalic, without obvious abnormality, atraumatic  Eyes:  Conjunctiva clear, EOM's intact  Ears EACs normal  bilaterally and normal TMs  bilaterally  Nose: Nares normal, hypertrophic turbinates, normal mucosa, and no visible anterior polyps  Throat: Lips, tongue normal; teeth and gums normal, normal posterior oropharynx  Neck: Supple, symmetrical  Lungs:   end-expiratory wheezing, Respirations unlabored, no coughing  Heart:  regular rate and rhythm and no murmur, Appears well perfused  Extremities: No edema  Skin: Skin color, texture, turgor normal and no rashes or lesions on visualized portions of skin  Neurologic: No gross deficits   Labs:  Lab Orders  No laboratory test(s) ordered today    Spirometry:  Tracings reviewed. Her effort: Good reproducible efforts. FVC: 3.16L FEV1: 2.76L, 71% predicted FEV1/FVC ratio: 0.86 Interpretation: Nonobstructive ratio, low FEV1, possible restriction.  Please see scanned spirometry results for details.  Assessment/Plan   Mild Persistent Asthma with exacerbation:  FEV1 71%, expiratory wheezing throughout on exam. - 40 mg IM depo in clinic today - tomorrow start prednisone  40 mg daily for 4 days. Rescheduling RUSH Chronic cough, particularly after viral illnesses and during allergy  seasons. Recent episode of dyspnea. Improvement with albuterol  and oral prednisone . Borderline low pulmonary function test results. - Controller Inhaler: Start Symbicort  160 mcg 2 puffs twice a day; This Should Be Used Everyday. Will replace Symbicort  80. - Rinse mouth out after use -use with spacer. - During respiratory illness or asthma flares: Increase symbicort  160 mcg 3 puffs twice daily  and continue for 2 weeks or until symptoms resolve. - Rescue Inhaler: Airsupra 2 puffs. Use  every 4-6 hours as needed for chest tightness, wheezing, or coughing.   Can also use 15 minutes prior to exercise if you have symptoms with activity. - Asthma is not controlled if:  - Symptoms are occurring >2 times a week OR  - >2 times a month nighttime awakenings  - You are requiring systemic steroids  (prednisone /steroid injections) more than once per year  - Your require hospitalization for your asthma.  - Please call the clinic to schedule a follow up if these symptoms arise Avoid smoke exposure Stay up-to-date with your annual flu vaccines, COVID vaccines and pneumonia vaccines when indicated.  Seasonal and perennial allergic rhinitis: not at goal Chronic symptoms managed with Zyrtec, Singulair , and Flonase. Recent increase in eye redness. - Dymista  (one spray each nostril twice a day as needed). -Consider Pataday or Zatidor eye drops for eye symptoms. -Continue Zyrtec 10 mg daily as needed -Continue Singulair  10 mg nightly -Allergy  testing 10/31/23 positive to rye grass, pigweed, red cedar and sycamore trees, Alternaria-outdoor mold, Aspergillus-indoor mold, horse and cockroach  IDs positive to grass pollen, ragweed, mold mix 3 and 4, dust mites, cat, dog Allergen avoidance. Holding RUSH for now until asthma controlled  Eczema-stab;e History of dry patches on elbow and leg, managed with moisturizers. -Continue current management.     Follow up : 4-6 weeks, sooner if needed. If doing well, will reschedule for RUSH. It was a pleasure seeing you again in clinic today! Thank you for allowing me to participate in your care.  Other: none  Rocky Endow, MD  [facility] 

## 2024-02-22 MED ORDER — BUDESONIDE-FORMOTEROL FUMARATE 160-4.5 MCG/ACT IN AERO: 2.0000 | INHALATION_SPRAY | Freq: Two times a day (BID) | RESPIRATORY_TRACT | 5 refills | Status: DC

## 2024-02-22 NOTE — Addendum Note (Signed)
 Addended by: WILLIAMS, Toussaint Golson H on: 02/22/2024 10:22 AM   Modules accepted: Orders

## 2024-02-27 ENCOUNTER — Ambulatory Visit: Admitting: Internal Medicine

## 2024-03-27 ENCOUNTER — Ambulatory Visit: Admitting: Internal Medicine

## 2024-03-27 ENCOUNTER — Encounter: Payer: Self-pay | Admitting: Internal Medicine

## 2024-03-27 VITALS — BP 112/78 | HR 65 | Temp 97.5°F | Resp 20 | Ht 71.0 in | Wt 207.4 lb

## 2024-03-27 DIAGNOSIS — J302 Other seasonal allergic rhinitis: Secondary | ICD-10-CM

## 2024-03-27 DIAGNOSIS — J4540 Moderate persistent asthma, uncomplicated: Secondary | ICD-10-CM | POA: Diagnosis not present

## 2024-03-27 DIAGNOSIS — H1013 Acute atopic conjunctivitis, bilateral: Secondary | ICD-10-CM | POA: Diagnosis not present

## 2024-03-27 DIAGNOSIS — L2082 Flexural eczema: Secondary | ICD-10-CM

## 2024-03-27 DIAGNOSIS — J3089 Other allergic rhinitis: Secondary | ICD-10-CM | POA: Diagnosis not present

## 2024-03-27 MED ORDER — AZELASTINE-FLUTICASONE 137-50 MCG/ACT NA SUSP: 1.0000 | Freq: Two times a day (BID) | NASAL | 5 refills | Status: DC | PRN

## 2024-03-27 MED ORDER — BUDESONIDE-FORMOTEROL FUMARATE 160-4.5 MCG/ACT IN AERO: 2.0000 | INHALATION_SPRAY | Freq: Two times a day (BID) | RESPIRATORY_TRACT | 5 refills | Status: DC

## 2024-03-27 MED ORDER — MONTELUKAST SODIUM 10 MG PO TABS: 10.0000 mg | ORAL_TABLET | Freq: Every day | ORAL | 1 refills | Status: DC

## 2024-03-27 MED ORDER — AIRSUPRA 90-80 MCG/ACT IN AERO: 2.0000 | INHALATION_SPRAY | RESPIRATORY_TRACT | 2 refills | Status: DC | PRN

## 2024-03-27 NOTE — Patient Instructions (Addendum)
 Mild Persistent Asthma - improved. Improved from previous visit. Rescheduling RUSH to August 19th - will receive a call from our clinic the week before regarding predmedications - Controller Inhaler: Start Symbicort  160 mcg 2 puffs twice a day; This Should Be Used Everyday. Will replace Symbicort  80. - Rinse mouth out after use -use with spacer. - During respiratory illness or asthma flares: Increase symbicort  160 mcg 3 puffs twice daily  and continue for 2 weeks or until symptoms resolve. - Rescue Inhaler: Airsupra  2 puffs. Use  every 4-6 hours as needed for chest tightness, wheezing, or coughing.   Can also use 15 minutes prior to exercise if you have symptoms with activity. - Asthma is not controlled if:  - Symptoms are occurring >2 times a week OR  - >2 times a month nighttime awakenings  - You are requiring systemic steroids (prednisone /steroid injections) more than once per year  - Your require hospitalization for your asthma.  - Please call the clinic to schedule a follow up if these symptoms arise Avoid smoke exposure Stay up-to-date with your annual flu vaccines, COVID vaccines and pneumonia vaccines when indicated.  Seasonal and perennial allergic rhinitis: Chronic symptoms managed with Zyrtec, Singulair , and Flonase. Recent increase in eye redness. - Dymista  (one spray each nostril twice a day as needed). -Consider Pataday or Zatidor eye drops for eye symptoms. -Continue Zyrtec 10 mg daily as needed -Continue Singulair  10 mg nightly -Allergy  testing 10/31/23 positive to rye grass, pigweed, red cedar and sycamore trees, Alternaria-outdoor mold, Aspergillus-indoor mold, horse and cockroach  IDs positive to grass pollen, ragweed, mold mix 3 and 4, dust mites, cat, dog Allergen avoidance. -Start allergy  injections to reduce lifetime symptoms and need for medications by teaching your immune system to become tolerant of the environmental allergens you are allergic  to  Eczema History of dry patches on elbow and leg, managed with moisturizers. -Continue current management.     Follow up : August 19th for [facility] immunotherapy It was a pleasure seeing you again in clinic today! Thank you for allowing me to participate in your care.  Debbie Endow, MD [facility] [place]  DUST MITE AVOIDANCE MEASURES:  There are three main measures that need and can be taken to avoid house dust mites:  Reduce accumulation of dust in general -reduce furniture, clothing, carpeting, books, stuffed animals, especially in bedroom  Separate yourself from the dust -use pillow and mattress encasements (can be found at stores such as [employer] or online) -avoid direct exposure to air condition flow -use a HEPA filter device, especially in the bedroom; you can also use a HEPA filter vacuum cleaner -wipe dust with a moist towel instead of a dry towel or broom when cleaning  Decrease mites and/or their secretions -wash clothing and linen and stuffed animals at highest temperature possible, at least every 2 weeks -stuffed animals can also be placed in a bag and put in a freezer overnight  Despite the above measures, it is impossible to eliminate dust mites or their allergen completely from your home.  With the above measures the burden of mites in your home can be diminished, with the goal of minimizing your allergic symptoms.  Success will be reached only when implementing and using all means together. Control of Dog or Cat Allergen  Avoidance is the best way to manage a dog or cat allergy . If you have a dog or cat and are allergic to dog or cats, consider removing  the dog or cat from the home. If you have a dog or cat but don't want to find it a new home, or if your family wants a pet even though someone in the household is allergic, here are some strategies that may help keep symptoms at bay:  Keep the pet out of your bedroom and restrict it to only a  few rooms. Be advised that keeping the dog or cat in only one room will not limit the allergens to that room. Don't pet, hug or kiss the dog or cat; if you do, wash your hands with soap and water. High-efficiency particulate air (HEPA) cleaners run continuously in a bedroom or living room can reduce allergen levels over time. Regular use of a high-efficiency vacuum cleaner or a central vacuum can reduce allergen levels. Giving your dog or cat a bath at least once a week can reduce airborne allergen. Reducing Pollen Exposure  The American Academy of Allergy , Asthma and Immunology suggests the following steps to reduce your exposure to pollen during allergy  seasons.    Do not hang sheets or clothing out to dry; pollen may collect on these items. Do not mow lawns or spend time around freshly cut grass; mowing stirs up pollen. Keep windows closed at night.  Keep car windows closed while driving. Minimize morning activities outdoors, a time when pollen counts are usually at their highest. Stay indoors as much as possible when pollen counts or humidity is high and on windy days when pollen tends to remain in the air longer. Use air conditioning when possible.  Many air conditioners have filters that trap the pollen spores. Use a HEPA room air filter to remove pollen form the indoor air you breathe. Control of Mold Allergen   Mold and fungi can grow on a variety of surfaces provided certain temperature and moisture conditions exist.  Outdoor molds grow on plants, decaying vegetation and soil.  The major outdoor mold, Alternaria and Cladosporium, are found in very high numbers during hot and dry conditions.  Generally, a late Summer - Fall peak is seen for common outdoor fungal spores.  Rain will temporarily lower outdoor mold spore count, but counts rise rapidly when the rainy period ends.  The most important indoor molds are Aspergillus and Penicillium.  Dark, humid and poorly ventilated basements are  ideal sites for mold growth.  The next most common sites of mold growth are the bathroom and the kitchen.  Outdoor (Seasonal) Mold Control  Use air conditioning and keep windows closed Avoid exposure to decaying vegetation. Avoid leaf raking. Avoid grain handling. Consider wearing a face mask if working in moldy areas.    Indoor (Perennial) Mold Control   Maintain humidity below 50%. Clean washable surfaces with 5% bleach solution. Remove sources e.g. contaminated carpets.

## 2024-03-27 NOTE — Progress Notes (Signed)
 FOLLOW UP Date of Service/Encounter:   03/27/2024  Subjective:  Debbie Patel (DOB: Jan 10, 1986) is a 38 y.o. female who [facility] in re-evaluation of the following:  Chronic cough suggestive of asthma, seasonal perennial allergic rhinitis  History obtained from: chart review and patient.  For Review, LV was on 02/21/24  with Dr.Marvina Danner seen for routine follow-up. See below for summary of history and diagnostics.   Therapeutic plans/changes recommended: asthma uncontrolled, generalized wheezing. [facility] appt cancelled. FEV! 71%, IM depo provided and a steroid burst was given. We started her on daily Symbicort  160. Discussed rescheduling [facility] once asthma controlled.  ----------------------------------------------------- Pertinent History/Diagnostics:  Chronic cough-history suggestive of asthma. Chronic cough, particularly after viral illnesses and during allergy  seasons. Recent episode of dyspnea. Improvement with albuterol  and oral prednisone . Borderline low pulmonary function test results. -Plan:Symbicort  160 (switched 01/2024) Seasonal and perennial allergic rhinitis Chronic symptoms managed with Zyrtec, Singulair , and Flonase. Recent increase in eye redness. -Plan: Dymista  (one spray each nostril twice a day as needed), Pataday or Zatidor eye drops for eye symptoms., Zyrtec 10 mg daily as needed, Singulair  10 mg nightly -Allergy  testing 10/31/23 positive to rye grass, pigweed, red cedar and sycamore trees, Alternaria-outdoor mold, Aspergillus-indoor mold, horse and cockroach  IDs positive to grass pollen, ragweed, mold mix 3 and 4, dust mites, cat, dog --------------------------------------------------- Today presents for follow-up. Discussed the use of AI scribe software for clinical note transcription with the patient, who gave verbal consent to proceed.  History of Present Illness Debbie Patel is a 38 year old female with  asthma who presents for follow-up regarding her asthma management and allergy  symptoms.  Asthma symptom control - Significant improvement in asthma symptoms since increasing the dose of daily inhaler - No coughing during a recent four-day work trip to [place] , [place], which is unusual for her - Coughing recurred immediately upon returning home and being around her dog - No use of rescue inhaler since recovering from a recent illness  Allergic triggers and environmental exposure - Coughing episodes are associated with exposure to her dog at home - No coughing when away from home and dog  Allergen immunotherapy consideration - Considering allergy  shots for symptom management - Concerned about scheduling logistics for allergy  shot appointments   All medications reviewed by clinical staff and updated in chart. No new pertinent medical or surgical history except as noted in HPI.  ROS: All others negative except as noted per HPI.   Objective:  BP 112/78 (BP Location: Right Arm, Patient Position: Sitting)   Pulse 65   Temp (!) 97.5 F (36.4 C) (Temporal)   Resp 20   Ht 5' 11 (1.803 m)   Wt 207 lb 6.4 oz (94.1 kg)   SpO2 99%   BMI 28.93 kg/m  Body mass index is 28.93 kg/m. Physical Exam: General Appearance:  Alert, cooperative, no distress, appears stated age  Head:  Normocephalic, without obvious abnormality, atraumatic  Eyes:  Conjunctiva clear, EOM's intact  Ears EACs normal bilaterally and normal TMs bilaterally  Nose: Nares normal, hypertrophic turbinates and normal mucosa  Throat: Lips, tongue normal; teeth and gums normal, normal posterior oropharynx  Neck: Supple, symmetrical  Lungs:   clear to auscultation bilaterally, Respirations unlabored, no coughing  Heart:  regular rate and rhythm and no murmur, Appears well perfused  Extremities: No edema  Skin: Skin color, texture, turgor normal and no rashes or lesions on visualized portions of skin  Neurologic: No gross  deficits   Labs:  Lab Orders  No laboratory test(s) ordered today    Spirometry:  Tracings reviewed. Her effort: Good reproducible efforts. FVC: 3.33L FEV1: 2.81L, 72% predicted FEV1/FVC ratio: 0.84 Interpretation: Nonobstructive ratio, low FEV1, possible restriction.  Please see scanned spirometry results for details.  Assessment/Plan   Mild Persistent Asthma - improved. Improved from previous visit. Rescheduling [facility] to August 19th - will receive a call from our clinic the week before regarding predmedications - Controller Inhaler: Start Symbicort  160 mcg 2 puffs twice a day; This Should Be Used Everyday. Will replace Symbicort  80. - Rinse mouth out after use -use with spacer. - During respiratory illness or asthma flares: Increase symbicort  160 mcg 3 puffs twice daily  and continue for 2 weeks or until symptoms resolve. - Rescue Inhaler: Airsupra  2 puffs. Use  every 4-6 hours as needed for chest tightness, wheezing, or coughing.   Can also use 15 minutes prior to exercise if you have symptoms with activity. - Asthma is not controlled if:  - Symptoms are occurring >2 times a week OR  - >2 times a month nighttime awakenings  - You are requiring systemic steroids (prednisone /steroid injections) more than once per year  - Your require hospitalization for your asthma.  - Please call the clinic to schedule a follow up if these symptoms arise Avoid smoke exposure Stay up-to-date with your annual flu vaccines, COVID vaccines and pneumonia vaccines when indicated.  Seasonal and perennial allergic rhinitis: not at goal, continues to have symptoms, worse around dog. Dog in the home. Plan to start AIT to reduce need for medication use and improve daily symptoms. Chronic symptoms managed with Zyrtec, Singulair , and Flonase. Recent increase in eye redness. - Dymista  (one spray each nostril twice a day as needed). -Consider Pataday or Zatidor eye drops for eye symptoms. -Continue Zyrtec 10  mg daily as needed -Continue Singulair  10 mg nightly -Allergy  testing 10/31/23 positive to rye grass, pigweed, red cedar and sycamore trees, Alternaria-outdoor mold, Aspergillus-indoor mold, horse and cockroach  IDs positive to grass pollen, ragweed, mold mix 3 and 4, dust mites, cat, dog Allergen avoidance. -Start allergy  injections to reduce lifetime symptoms and need for medications by teaching your immune system to become tolerant of the environmental allergens you are allergic to  Eczema-at goal History of dry patches on elbow and leg, managed with moisturizers. -Continue current management.     Follow up : August 19th for [facility] immunotherapy It was a pleasure seeing you again in clinic today! Thank you for allowing me to participate in your care.  Other: none  Rocky Endow, MD  [facility] 

## 2024-04-04 ENCOUNTER — Other Ambulatory Visit: Payer: Self-pay | Admitting: Internal Medicine

## 2024-04-12 ENCOUNTER — Telehealth: Payer: Self-pay

## 2024-04-12 ENCOUNTER — Encounter: Payer: Self-pay | Admitting: Family

## 2024-04-12 MED ORDER — PREDNISONE 20 MG PO TABS: ORAL_TABLET | ORAL | 0 refills | Status: DC

## 2024-04-12 MED ORDER — FAMOTIDINE 20 MG PO TABS: ORAL_TABLET | ORAL | 0 refills | Status: DC

## 2024-04-12 MED ORDER — LEVOCETIRIZINE DIHYDROCHLORIDE 5 MG PO TABS: ORAL_TABLET | ORAL | 0 refills | Status: DC

## 2024-04-12 NOTE — Telephone Encounter (Signed)
 Called pt left a VM that pre-meds was sent to the pharmacy and to call office Monday if she had any other questions. Instructions sent VIA MyChart.

## 2024-04-16 ENCOUNTER — Ambulatory Visit: Admitting: Internal Medicine

## 2024-04-16 VITALS — BP 102/62 | HR 61 | Temp 98.1°F | Resp 16

## 2024-04-16 DIAGNOSIS — J302 Other seasonal allergic rhinitis: Secondary | ICD-10-CM

## 2024-04-16 DIAGNOSIS — J3089 Other allergic rhinitis: Secondary | ICD-10-CM

## 2024-04-16 NOTE — Progress Notes (Unsigned)
 RAPID DESENSITIZATION Note  RE: Debbie Patel MRN: [id number] DOB: 07-Jun-1986 Date of Office Visit: 04/16/2024  Subjective:  Patient presents today for rapid desensitization.  Interval History: Patient has not been ill, she has taken all premedications as per protocol.  Recent/Current History: Pulmonary disease: no Cardiac disease: no Respiratory infection: no Rash: no Itch: no Swelling: no Cough: no Shortness of breath: no Runny/stuffy nose: no Itchy eyes: no Beta-blocker use: no  Patient/guardian was informed of the procedure with verbalized understanding of the risk of anaphylaxis. Consent has been signed.   Medication List:  Current Outpatient Medications  Medication Sig Dispense Refill   acetaminophen  (TYLENOL ) 500 MG tablet Take 2 tablets (1,000 mg total) by mouth every 6 (six) hours. 30 tablet 0   albuterol  (VENTOLIN  HFA) 108 (90 Base) MCG/ACT inhaler Inhale 2 puffs into the lungs every 4 (four) hours as needed for wheezing or shortness of breath. 8.5 g 2   Albuterol -Budesonide  (AIRSUPRA ) 90-80 MCG/ACT AERO Inhale 2 puffs into the lungs as needed (wheezing/shortness of breath). maximum 12 puffs/day 10.7 g 2   Azelastine -Fluticasone  (DYMISTA ) 137-50 MCG/ACT SUSP Place 1 spray into both nostrils 2 (two) times daily as needed. 23 g 5   budesonide -formoterol  (SYMBICORT ) 160-4.5 MCG/ACT inhaler Inhale 2 puffs into the lungs 2 (two) times daily. 1 each 5   EPINEPHrine  (EPIPEN  2-PAK) 0.3 mg/0.3 mL IJ SOAJ injection Inject 0.3 mg into the muscle as needed for anaphylaxis. Bring to your allergy  injection appointments. 2 each 2   famotidine  (PEPCID ) 20 MG tablet Take 1 tab twice daily Monday and Tuesday before [facility] appt. 4 tablet 0   fluconazole  (DIFLUCAN ) 150 MG tablet Take 150 mg by mouth once for yeast infection.  Can repeat in 1 week if needed 2 tablet 0   ibuprofen  (ADVIL ) 600 MG tablet Take 1 tablet (600 mg total) by mouth every 6 (six) hours. 30 tablet 0   levocetirizine  (XYZAL ) 5 MG tablet Take 1 tab Monday and Tuesday morning before [facility] appt. 2 tablet 0   meloxicam (MOBIC) 15 MG tablet Take 1 tab daily for 7 days, then take as needed.     montelukast  (SINGULAIR ) 10 MG tablet Take 1 tablet (10 mg total) by mouth at bedtime. 90 tablet 1   predniSONE  (DELTASONE ) 20 MG tablet Take 2 tabs Monday morning and Tuesday before [facility] appt. 4 tablet 0   scopolamine  (TRANSDERM-SCOP) 1 MG/3DAYS Place 1 patch (1.5 mg total) onto the skin every 3 (three) days. 3 patch 0   SYNTHROID  200 MCG tablet Take 200-300 mcg by mouth daily. Taking 200mcg daily except on Sunday taking 300mcg     No current facility-administered medications for this visit.   Allergies: Allergies  Allergen Reactions   Nickel Rash and Hives   Other Rash    Nickle    I reviewed her past medical history, social history, family history, and environmental history and no significant changes have been reported from her previous visit.  ROS: Negative except as per HPI.  Objective: There were no vitals taken for this visit. There is no height or weight on file to calculate BMI.   General Appearance:  Alert, cooperative, no distress, appears stated age  Head:  Normocephalic, without obvious abnormality, atraumatic  Eyes:  Conjunctiva clear, EOM's intact  Nose: Nares normal  Throat: Lips, tongue normal; teeth and gums normal, normal posterior oropharnyx  Neck: Supple, symmetrical  Lungs:   CTAB, Respirations unlabored, no coughing  Heart:  Appears well perfused  Extremities:  No edema  Skin: Skin color, texture, turgor normal, no rashes or lesions on visualized portions of skin  Neurologic: No gross deficits     Diagnostics:  PROCEDURES:  Patient received the following doses every hour: Step 1:  0.47ml - 1:1,000,000 dilution (silver vial) Step 2:  0.54ml - 1:1,000,000 dilution (silver vial) Step 3: 0.1ml - 1:100,000 dilution (blue vial)  Step 4: 0.3ml - 1:100,000 dilution (blue vial)  Step 5:  0.1ml - 1:10,000 dilution (gold vial) Step 6: 0.2ml - 1:10,000 dilution (gold vial) Step 7: 0.3ml - 1:10,000 dilution (gold vial) Step 8: 0.4ml - 1:10,000 dilution (gold vial)  Patient was observed for 1 hour after the last dose.   Procedure started at 8:52 Procedure ended at 4:49   ASSESSMENT/PLAN:   Patient has tolerated the rapid desensitization protocol.  Next appointment: Start at 0.05ml of 1:1000 dilution (green vial) and build up per protocol.

## 2024-04-23 ENCOUNTER — Encounter: Payer: Self-pay | Admitting: Internal Medicine

## 2024-04-23 ENCOUNTER — Ambulatory Visit
Admission: RE | Admit: 2024-04-23 | Discharge: 2024-04-23 | Disposition: A | Discharge status: Home / Self Care | Source: Ambulatory Visit | Attending: Family Medicine | Admitting: Family Medicine

## 2024-04-23 ENCOUNTER — Ambulatory Visit (INDEPENDENT_AMBULATORY_CARE_PROVIDER_SITE_OTHER): Payer: Self-pay

## 2024-04-23 VITALS — BP 110/69 | HR 60 | Temp 98.4°F | Resp 16

## 2024-04-23 DIAGNOSIS — R070 Pain in throat: Secondary | ICD-10-CM | POA: Diagnosis present

## 2024-04-23 DIAGNOSIS — J309 Allergic rhinitis, unspecified: Secondary | ICD-10-CM

## 2024-04-23 DIAGNOSIS — B370 Candidal stomatitis: Secondary | ICD-10-CM

## 2024-04-23 LAB — POCT RAPID STREP A (OFFICE): Rapid Strep A Screen: NEGATIVE

## 2024-04-23 MED ORDER — NYSTATIN 100000 UNIT/ML MT SUSP: 500000.0000 [IU] | Freq: Four times a day (QID) | OROMUCOSAL | 0 refills | Status: DC

## 2024-04-23 NOTE — ED Triage Notes (Signed)
 Pt c/o bilat neck lymph node swelling started 8/22-white patches to back of throat noticed last night-NAD-steady gait

## 2024-04-23 NOTE — ED Provider Notes (Signed)
 [facility]  Note:  This document was prepared using Conservation officer, historic buildings and may include unintentional dictation errors.  MRN: [id number] DOB: 1985-10-26  Subjective:   Debbie Patel is a 38 y.o. female presenting for 4-day history of white patches along her throat.  Has had intermittent swelling of her lymph nodes along her neck with some discomfort.  No fever, throat pain, painful swallowing.  Patient has previously had oral thrush related to the use of an inhaled steroid.  This was a few years ago.  She still has to use an inhaled steroid.  No current facility-administered medications for this encounter.  Current Outpatient Medications:    acetaminophen  (TYLENOL ) 500 MG tablet, Take 2 tablets (1,000 mg total) by mouth every 6 (six) hours., Disp: 30 tablet, Rfl: 0   albuterol  (VENTOLIN  HFA) 108 (90 Base) MCG/ACT inhaler, Inhale 2 puffs into the lungs every 4 (four) hours as needed for wheezing or shortness of breath., Disp: 8.5 g, Rfl: 2   Albuterol -Budesonide  (AIRSUPRA ) 90-80 MCG/ACT AERO, Inhale 2 puffs into the lungs as needed (wheezing/shortness of breath). maximum 12 puffs/day, Disp: 10.7 g, Rfl: 2   Azelastine -Fluticasone  (DYMISTA ) 137-50 MCG/ACT SUSP, Place 1 spray into both nostrils 2 (two) times daily as needed., Disp: 23 g, Rfl: 5   budesonide -formoterol  (SYMBICORT ) 160-4.5 MCG/ACT inhaler, Inhale 2 puffs into the lungs 2 (two) times daily., Disp: 1 each, Rfl: 5   EPINEPHrine  (EPIPEN  2-PAK) 0.3 mg/0.3 mL IJ SOAJ injection, Inject 0.3 mg into the muscle as needed for anaphylaxis. Bring to your allergy  injection appointments., Disp: 2 each, Rfl: 2   famotidine  (PEPCID ) 20 MG tablet, Take 1 tab twice daily Monday and Tuesday before [facility] appt., Disp: 4 tablet, Rfl: 0   fluconazole  (DIFLUCAN ) 150 MG tablet, Take 150 mg by mouth once for yeast infection.  Can repeat in 1 week if needed, Disp: 2 tablet, Rfl: 0   ibuprofen  (ADVIL ) 600 MG tablet, Take  1 tablet (600 mg total) by mouth every 6 (six) hours., Disp: 30 tablet, Rfl: 0   levocetirizine (XYZAL ) 5 MG tablet, Take 1 tab Monday and Tuesday morning before [facility] appt., Disp: 2 tablet, Rfl: 0   meloxicam (MOBIC) 15 MG tablet, Take 1 tab daily for 7 days, then take as needed., Disp: , Rfl:    montelukast  (SINGULAIR ) 10 MG tablet, Take 1 tablet (10 mg total) by mouth at bedtime., Disp: 90 tablet, Rfl: 1   predniSONE  (DELTASONE ) 20 MG tablet, Take 2 tabs Monday morning and Tuesday before [facility] appt., Disp: 4 tablet, Rfl: 0   scopolamine  (TRANSDERM-SCOP) 1 MG/3DAYS, Place 1 patch (1.5 mg total) onto the skin every 3 (three) days., Disp: 3 patch, Rfl: 0   SYNTHROID  200 MCG tablet, Take 200-300 mcg by mouth daily. Taking 200mcg daily except on Sunday taking 300mcg, Disp: , Rfl:    Allergies  Allergen Reactions   Nickel Rash and Hives   Other Rash    Nickle     Past Medical History:  Diagnosis Date   Eczema    Eustachian tube dysfunction    Hashimoto's thyroiditis    Hypothyroidism    Nontoxic multinodular goiter    Urticaria      Past Surgical History:  Procedure Laterality Date   CHOLECYSTECTOMY  05/29/2017   patient reports   CYSTECTOMY Left    left wrist ganglion cyst   THYROIDECTOMY  08/20/2012   WISDOM TOOTH EXTRACTION      Family History  Problem Relation  Age of Onset   Stroke Mother    Esophageal cancer Father        heavy smoker and drink   Tremor Brother    Drug abuse Brother    CVA Maternal Grandmother    Alzheimer's disease Maternal Grandfather    Alzheimer's disease Paternal Grandmother    CAD Paternal Grandfather    Allergic rhinitis Neg Hx    Asthma Neg Hx    Eczema Neg Hx    Urticaria Neg Hx     Social History   Tobacco Use   Smoking status: Never    Passive exposure: Never   Smokeless tobacco: Never  Vaping Use   Vaping status: Never Used  Substance Use Topics   Alcohol use: Yes    Comment: occasionally   Drug use: No     ROS   Objective:   Vitals: BP 110/69 (BP Location: Right Arm)   Pulse 60   Temp 98.4 F (36.9 C) (Oral)   Resp 16   LMP 04/21/2024   SpO2 98%   Physical Exam Constitutional:      General: She is not in acute distress.    Appearance: Normal appearance. She is well-developed. She is not ill-appearing, toxic-appearing or diaphoretic.  HENT:     Head: Normocephalic and atraumatic.     Nose: Nose normal.     Mouth/Throat:     Mouth: Mucous membranes are moist.     Pharynx: Posterior oropharyngeal erythema present. No pharyngeal swelling, oropharyngeal exudate or uvula swelling.     Tonsils: No tonsillar exudate or tonsillar abscesses. 0 on the right. 0 on the left.   Eyes:     General: No scleral icterus.       Right eye: No discharge.        Left eye: No discharge.     Extraocular Movements: Extraocular movements intact.  Cardiovascular:     Rate and Rhythm: Normal rate.  Pulmonary:     Effort: Pulmonary effort is normal.  Skin:    General: Skin is warm and dry.  Neurological:     General: No focal deficit present.     Mental Status: She is alert and oriented to person, place, and time.  Psychiatric:        Mood and Affect: Mood normal.        Behavior: Behavior normal.    Rapid strep test negative.  Assessment and Plan :   PDMP not reviewed this encounter.  1. Oral candidiasis   2. Throat pain    Strep culture pending, recommended managing for oral candidiasis with nystatin  oral swish.  Counseled patient on potential for adverse effects with medications prescribed/recommended today, ER and return-to-clinic precautions discussed, patient verbalized understanding.    Christopher Savannah, [place] 04/23/24 1102

## 2024-04-26 ENCOUNTER — Ambulatory Visit (HOSPITAL_COMMUNITY): Payer: Self-pay

## 2024-04-26 LAB — CULTURE, GROUP A STREP (THRC)

## 2024-04-27 ENCOUNTER — Ambulatory Visit
Admission: RE | Admit: 2024-04-27 | Discharge: 2024-04-27 | Disposition: A | Discharge status: Home / Self Care | Source: Ambulatory Visit | Attending: Family Medicine | Admitting: Family Medicine

## 2024-04-27 ENCOUNTER — Encounter: Payer: Self-pay | Admitting: Internal Medicine

## 2024-04-27 VITALS — BP 111/73 | HR 71 | Temp 99.0°F | Resp 16

## 2024-04-27 DIAGNOSIS — J4541 Moderate persistent asthma with (acute) exacerbation: Secondary | ICD-10-CM | POA: Diagnosis not present

## 2024-04-27 DIAGNOSIS — R051 Acute cough: Secondary | ICD-10-CM | POA: Diagnosis not present

## 2024-04-27 DIAGNOSIS — J209 Acute bronchitis, unspecified: Secondary | ICD-10-CM | POA: Diagnosis not present

## 2024-04-27 LAB — POC SOFIA SARS ANTIGEN FIA: SARS Coronavirus 2 Ag: NEGATIVE

## 2024-04-27 MED ORDER — FLUCONAZOLE 150 MG PO TABS: 150.0000 mg | ORAL_TABLET | ORAL | 0 refills | Status: DC

## 2024-04-27 MED ORDER — PREDNISONE 20 MG PO TABS: ORAL_TABLET | ORAL | 0 refills | Status: DC

## 2024-04-27 NOTE — Discharge Instructions (Addendum)
 Will start prednisone  to help with your respiratory symptoms and underlying asthma, suspected bronchitis.  Maintain all of your asthma treatments.  Hold the nystatin  medication and changed to fluconazole .  Fluconazole  can also help against oral thrush and yeast infections that can be brought about from the use of steroids.  You can use nystatin  in the future as needed thereafter.  Will let you know about your COVID test result later today.

## 2024-04-27 NOTE — ED Triage Notes (Signed)
 Pt c/o prod cough x 4 days-denies fever-NAD-steady gait

## 2024-04-27 NOTE — ED Provider Notes (Signed)
 [facility]  Note:  This document was prepared using Conservation officer, historic buildings and may include unintentional dictation errors.  MRN: [id number] DOB: August 26, 1986  Subjective:   Debbie Patel is a 38 y.o. female presenting for 4 day history of productive cough with chest pain, shob, wheezing. Has moderate persistent asthma, perennial allergic rhinitis.  Was seen earlier this week and treated for thrush with good results.  At the time she did not have respiratory symptoms but developed in the day after.  No current facility-administered medications for this encounter.  Current Outpatient Medications:    acetaminophen  (TYLENOL ) 500 MG tablet, Take 2 tablets (1,000 mg total) by mouth every 6 (six) hours., Disp: 30 tablet, Rfl: 0   albuterol  (VENTOLIN  HFA) 108 (90 Base) MCG/ACT inhaler, Inhale 2 puffs into the lungs every 4 (four) hours as needed for wheezing or shortness of breath., Disp: 8.5 g, Rfl: 2   Albuterol -Budesonide  (AIRSUPRA ) 90-80 MCG/ACT AERO, Inhale 2 puffs into the lungs as needed (wheezing/shortness of breath). maximum 12 puffs/day, Disp: 10.7 g, Rfl: 2   Azelastine -Fluticasone  (DYMISTA ) 137-50 MCG/ACT SUSP, Place 1 spray into both nostrils 2 (two) times daily as needed., Disp: 23 g, Rfl: 5   budesonide -formoterol  (SYMBICORT ) 160-4.5 MCG/ACT inhaler, Inhale 2 puffs into the lungs 2 (two) times daily., Disp: 1 each, Rfl: 5   EPINEPHrine  (EPIPEN  2-PAK) 0.3 mg/0.3 mL IJ SOAJ injection, Inject 0.3 mg into the muscle as needed for anaphylaxis. Bring to your allergy  injection appointments., Disp: 2 each, Rfl: 2   famotidine  (PEPCID ) 20 MG tablet, Take 1 tab twice daily Monday and Tuesday before [facility] appt., Disp: 4 tablet, Rfl: 0   fluconazole  (DIFLUCAN ) 150 MG tablet, Take 150 mg by mouth once for yeast infection.  Can repeat in 1 week if needed, Disp: 2 tablet, Rfl: 0   ibuprofen  (ADVIL ) 600 MG tablet, Take 1 tablet (600 mg total) by mouth every 6 (six)  hours., Disp: 30 tablet, Rfl: 0   levocetirizine (XYZAL ) 5 MG tablet, Take 1 tab Monday and Tuesday morning before [facility] appt., Disp: 2 tablet, Rfl: 0   meloxicam (MOBIC) 15 MG tablet, Take 1 tab daily for 7 days, then take as needed., Disp: , Rfl:    montelukast  (SINGULAIR ) 10 MG tablet, Take 1 tablet (10 mg total) by mouth at bedtime., Disp: 90 tablet, Rfl: 1   nystatin  (MYCOSTATIN ) 100000 UNIT/ML suspension, Take 5 mLs (500,000 Units total) by mouth 4 (four) times daily., Disp: 473 mL, Rfl: 0   predniSONE  (DELTASONE ) 20 MG tablet, Take 2 tabs Monday morning and Tuesday before [facility] appt., Disp: 4 tablet, Rfl: 0   scopolamine  (TRANSDERM-SCOP) 1 MG/3DAYS, Place 1 patch (1.5 mg total) onto the skin every 3 (three) days., Disp: 3 patch, Rfl: 0   SYNTHROID  200 MCG tablet, Take 200-300 mcg by mouth daily. Taking 200mcg daily except on Sunday taking 300mcg, Disp: , Rfl:    Allergies  Allergen Reactions   Nickel Rash and Hives    Past Medical History:  Diagnosis Date   Eczema    Eustachian tube dysfunction    Hashimoto's thyroiditis    Hypothyroidism    Nontoxic multinodular goiter    Urticaria      Past Surgical History:  Procedure Laterality Date   CHOLECYSTECTOMY  05/29/2017   patient reports   CYSTECTOMY Left    left wrist ganglion cyst   THYROIDECTOMY  08/20/2012   WISDOM TOOTH EXTRACTION      Family History  Problem  Relation Age of Onset   Stroke Mother    Esophageal cancer Father        heavy smoker and drink   Tremor Brother    Drug abuse Brother    CVA Maternal Grandmother    Alzheimer's disease Maternal Grandfather    Alzheimer's disease Paternal Grandmother    CAD Paternal Grandfather    Allergic rhinitis Neg Hx    Asthma Neg Hx    Eczema Neg Hx    Urticaria Neg Hx     Social History   Tobacco Use   Smoking status: Never    Passive exposure: Never   Smokeless tobacco: Never  Vaping Use   Vaping status: Never Used  Substance Use Topics   Alcohol use: Yes     Comment: occasionally   Drug use: No    ROS   Objective:   Vitals: BP 111/73 (BP Location: Right Arm)   Pulse 71   Temp 99 F (37.2 C) (Oral)   Resp 16   LMP 04/21/2024   SpO2 99%   Physical Exam Constitutional:      General: She is not in acute distress.    Appearance: Normal appearance. She is well-developed and normal weight. She is not ill-appearing, toxic-appearing or diaphoretic.  HENT:     Head: Normocephalic and atraumatic.     Right Ear: Tympanic membrane, ear canal and external ear normal. No drainage or tenderness. No middle ear effusion. There is no impacted cerumen. Tympanic membrane is not erythematous or bulging.     Left Ear: Tympanic membrane, ear canal and external ear normal. No drainage or tenderness.  No middle ear effusion. There is no impacted cerumen. Tympanic membrane is not erythematous or bulging.     Nose: Nose normal. No congestion or rhinorrhea.     Mouth/Throat:     Mouth: Mucous membranes are moist. No oral lesions.     Pharynx: No pharyngeal swelling, oropharyngeal exudate, posterior oropharyngeal erythema or uvula swelling.     Tonsils: No tonsillar exudate or tonsillar abscesses.  Eyes:     General: No scleral icterus.       Right eye: No discharge.        Left eye: No discharge.     Extraocular Movements: Extraocular movements intact.     Right eye: Normal extraocular motion.     Left eye: Normal extraocular motion.     Conjunctiva/sclera: Conjunctivae normal.  Cardiovascular:     Rate and Rhythm: Normal rate and regular rhythm.     Heart sounds: Normal heart sounds. No murmur heard.    No friction rub. No gallop.  Pulmonary:     Effort: Pulmonary effort is normal. No respiratory distress.     Breath sounds: No stridor. Wheezing and rhonchi present. No rales.     Comments: Trace rhonchi and wheezing over mid lung fields bilaterally. Chest:     Chest wall: No tenderness.  Musculoskeletal:     Cervical back: Normal range of  motion and neck supple.  Lymphadenopathy:     Cervical: No cervical adenopathy.  Skin:    General: Skin is warm and dry.  Neurological:     General: No focal deficit present.     Mental Status: She is alert and oriented to person, place, and time.  Psychiatric:        Mood and Affect: Mood normal.        Behavior: Behavior normal.     Results for orders placed or performed during  the hospital encounter of 04/27/24 (from the past 24 hours)  POC SARS Coronavirus 2 Ag     Status: Normal   Collection Time: 04/27/24  1:09 PM  Result Value Ref Range   SARS Coronavirus 2 Ag Negative Negative    Assessment and Plan :   PDMP not reviewed this encounter.  1. Acute bronchitis, unspecified organism   2. Acute cough   3. Moderate persistent asthma with (acute) exacerbation    Recommend oral prednisone  course, maintain asthma treatments.  Use supportive care otherwise.  Will supplement treatment with fluconazole  she just had clearance of oral thrush.  Counseled patient on potential for adverse effects with medications prescribed/recommended today, ER and return-to-clinic precautions discussed, patient verbalized understanding.    Christopher Savannah, [place] 04/27/24 1320

## 2024-05-02 ENCOUNTER — Telehealth: Admitting: Physician Assistant

## 2024-05-02 DIAGNOSIS — R3989 Other symptoms and signs involving the genitourinary system: Secondary | ICD-10-CM

## 2024-05-02 DIAGNOSIS — T3695XA Adverse effect of unspecified systemic antibiotic, initial encounter: Secondary | ICD-10-CM | POA: Diagnosis not present

## 2024-05-02 DIAGNOSIS — B379 Candidiasis, unspecified: Secondary | ICD-10-CM | POA: Diagnosis not present

## 2024-05-02 MED ORDER — NITROFURANTOIN MONOHYD MACRO 100 MG PO CAPS: 100.0000 mg | ORAL_CAPSULE | Freq: Two times a day (BID) | ORAL | 0 refills | Status: DC

## 2024-05-02 MED ORDER — FLUCONAZOLE 150 MG PO TABS
150.0000 mg | ORAL_TABLET | ORAL | 0 refills | Status: DC | PRN
Start: 2024-05-02 — End: 2024-06-18

## 2024-05-02 NOTE — Patient Instructions (Signed)
 Lauraine Slade, thank you for joining [name], PA-C for today's virtual visit.  While this provider is not your primary care provider (PCP), if your PCP is located in our provider database this encounter information will be shared with them immediately following your visit.   A [facility] MyChart account gives you access to today's visit and all your visits, tests, and labs performed at [facility]  click here if you don't have a [facility] MyChart account or go to [email or web address]  Consent: (Patient) Debbie Patel provided verbal consent for this virtual visit at the beginning of the encounter.  Current Medications:  Current Outpatient Medications:    fluconazole  (DIFLUCAN ) 150 MG tablet, Take 1 tablet (150 mg total) by mouth every 3 (three) days as needed., Disp: 2 tablet, Rfl: 0   nitrofurantoin , macrocrystal-monohydrate, (MACROBID ) 100 MG capsule, Take 1 capsule (100 mg total) by mouth 2 (two) times daily., Disp: 10 capsule, Rfl: 0   acetaminophen  (TYLENOL ) 500 MG tablet, Take 2 tablets (1,000 mg total) by mouth every 6 (six) hours., Disp: 30 tablet, Rfl: 0   albuterol  (VENTOLIN  HFA) 108 (90 Base) MCG/ACT inhaler, Inhale 2 puffs into the lungs every 4 (four) hours as needed for wheezing or shortness of breath., Disp: 8.5 g, Rfl: 2   Albuterol -Budesonide  (AIRSUPRA ) 90-80 MCG/ACT AERO, Inhale 2 puffs into the lungs as needed (wheezing/shortness of breath). maximum 12 puffs/day, Disp: 10.7 g, Rfl: 2   Azelastine -Fluticasone  (DYMISTA ) 137-50 MCG/ACT SUSP, Place 1 spray into both nostrils 2 (two) times daily as needed., Disp: 23 g, Rfl: 5   budesonide -formoterol  (SYMBICORT ) 160-4.5 MCG/ACT inhaler, Inhale 2 puffs into the lungs 2 (two) times daily., Disp: 1 each, Rfl: 5   EPINEPHrine  (EPIPEN  2-PAK) 0.3 mg/0.3 mL IJ SOAJ injection, Inject 0.3 mg into the muscle as needed for anaphylaxis. Bring to your allergy  injection appointments., Disp: 2 each, Rfl: 2    famotidine  (PEPCID ) 20 MG tablet, Take 1 tab twice daily Monday and Tuesday before [facility] appt., Disp: 4 tablet, Rfl: 0   fluconazole  (DIFLUCAN ) 150 MG tablet, Take 1 tablet (150 mg total) by mouth every 3 (three) days., Disp: 3 tablet, Rfl: 0   ibuprofen  (ADVIL ) 600 MG tablet, Take 1 tablet (600 mg total) by mouth every 6 (six) hours., Disp: 30 tablet, Rfl: 0   levocetirizine (XYZAL ) 5 MG tablet, Take 1 tab Monday and Tuesday morning before [facility] appt., Disp: 2 tablet, Rfl: 0   meloxicam (MOBIC) 15 MG tablet, Take 1 tab daily for 7 days, then take as needed., Disp: , Rfl:    montelukast  (SINGULAIR ) 10 MG tablet, Take 1 tablet (10 mg total) by mouth at bedtime., Disp: 90 tablet, Rfl: 1   nystatin  (MYCOSTATIN ) 100000 UNIT/ML suspension, Take 5 mLs (500,000 Units total) by mouth 4 (four) times daily., Disp: 473 mL, Rfl: 0   predniSONE  (DELTASONE ) 20 MG tablet, Take 2 tablets daily with breakfast., Disp: 10 tablet, Rfl: 0   scopolamine  (TRANSDERM-SCOP) 1 MG/3DAYS, Place 1 patch (1.5 mg total) onto the skin every 3 (three) days., Disp: 3 patch, Rfl: 0   SYNTHROID  200 MCG tablet, Take 200-300 mcg by mouth daily. Taking 200mcg daily except on Sunday taking 300mcg, Disp: , Rfl:    Medications ordered in this encounter:  Meds ordered this encounter  Medications   nitrofurantoin , macrocrystal-monohydrate, (MACROBID ) 100 MG capsule    Sig: Take 1 capsule (100 mg total) by mouth 2 (two) times daily.    Dispense:  10 capsule  Refill:  0    Supervising Provider:   BLAISE ALEENE KIDD [[id number]]   fluconazole  (DIFLUCAN ) 150 MG tablet    Sig: Take 1 tablet (150 mg total) by mouth every 3 (three) days as needed.    Dispense:  2 tablet    Refill:  0    Supervising Provider:   LAMPTEY, PHILIP O [[id number]]     *If you need refills on other medications prior to your next appointment, please contact your pharmacy*  Follow-Up: Call back or seek an in-person evaluation if the symptoms worsen or if the condition  fails to improve as anticipated.  [facility] [phone]  Other Instructions Urinary Tract Infection, Female A urinary tract infection (UTI) is an infection in your urinary tract. The urinary tract is made up of organs that make, store, and get rid of pee (urine) in your body. These organs include: The kidneys. The ureters. The bladder. The urethra. What are the causes? Most UTIs are caused by germs called bacteria. They may be in or near your genitals. These germs grow and cause swelling in your urinary tract. What increases the risk? You're more likely to get a UTI if: You're a female. The urethra is shorter in females than in males. You have a soft tube called a catheter that drains your pee. You can't control when you pee or poop. You have trouble peeing because of: A kidney stone. A urinary blockage. A nerve condition that affects your bladder. Not getting enough to drink. You're sexually active. You use a birth control inside your vagina, like spermicide. You're pregnant. You have low levels of the hormone estrogen in your body. You're an older adult. You're also more likely to get a UTI if you have other health problems. These may include: Diabetes. A weak immune system. Your immune system is your body's defense system. Sickle cell disease. Injury of the spine. What are the signs or symptoms? Symptoms may include: Needing to pee right away. Peeing small amounts often. Pain or burning when you pee. Blood in your pee. Pee that smells bad or odd. Pain in your belly or lower back. You may also: Feel confused. This may be the first symptom in older adults. Vomit. Not feel hungry. Feel tired or easily annoyed. Have a fever or chills. How is this diagnosed? A UTI is diagnosed based on your medical history and an exam. You may also have other tests. These may include: Pee tests. Blood tests. Tests for sexually transmitted infections (STIs). If  you've had more than one UTI, you may need to have imaging studies done to find out why you keep getting them. How is this treated? A UTI can be treated by: Taking antibiotics or other medicines. Drinking enough fluid to keep your pee pale yellow. In rare cases, a UTI can cause a very bad condition called sepsis. Sepsis may be treated in the hospital. Follow these instructions at home: Medicines Take your medicines only as told by your health care provider. If you were given antibiotics, take them as told by your provider. Do not stop taking them even if you start to feel better. General instructions Make sure you: Pee often and fully. Do not hold your pee for a long time. Wipe from front to back after you pee or poop. Use each tissue only once when you wipe. Pee after you have sex. Do not douche or use sprays or powders in your genital area. Contact a health care provider if:  Your symptoms don't get better after 1-2 days of taking antibiotics. Your symptoms go away and then come back. You have a fever or chills. You vomit or feel like you may vomit. Get help right away if: You have very bad pain in your back or lower belly. You faint. This information is not intended to replace advice given to you by your health care provider. Make sure you discuss any questions you have with your health care provider. Document Revised: 07/26/2023 Document Reviewed: 11/18/2022 Elsevier Patient Education  [employer].   If you have been instructed to have an in-person evaluation today at a local Urgent Care facility, please use the link below. It will take you to a list of all of our available [facility], including address, phone number and hours of operation. Please do not delay care.  [facility]  If you or a family member do not have a primary care provider, use the link below to schedule a visit and establish care. When you choose a [facility] primary care  physician or advanced practice provider, you gain a long-term partner in health. Find a Primary Care Provider  Learn more about [facility]'s in-office and virtual care options: [facility] - Get Care Now

## 2024-05-02 NOTE — Progress Notes (Signed)
 Virtual Visit Consent   Debbie Patel, you are scheduled for a virtual visit with a [facility] provider today. Just as with appointments in the office, your consent must be obtained to participate. Your consent will be active for this visit and any virtual visit you may have with one of our providers in the next 365 days. If you have a MyChart account, a copy of this consent can be sent to you electronically.  As this is a virtual visit, video technology does not allow for your provider to perform a traditional examination. This may limit your provider's ability to fully assess your condition. If your provider identifies any concerns that need to be evaluated in person or the need to arrange testing (such as labs, EKG, etc.), we will make arrangements to do so. Although advances in technology are sophisticated, we cannot ensure that it will always work on either your end or our end. If the connection with a video visit is poor, the visit may have to be switched to a telephone visit. With either a video or telephone visit, we are not always able to ensure that we have a secure connection.  By engaging in this virtual visit, you consent to the provision of healthcare and authorize for your insurance to be billed (if applicable) for the services provided during this visit. Depending on your insurance coverage, you may receive a charge related to this service.  I need to obtain your verbal consent now. Are you willing to proceed with your visit today? Debbie Patel has provided verbal consent on 05/02/2024 for a virtual visit (video or telephone). [name], PA-C  Date: 05/02/2024 9:31 AM   Virtual Visit via Video Note   I, [name], connected with  Debbie Patel  ([id number], 08-19-1986) on 05/02/24 at  9:15 AM EDT by a video-enabled telemedicine application and verified that I am speaking with the correct person using two identifiers.  Location: Patient: Virtual Visit  Location Patient: Home Provider: Virtual Visit Location Provider: Home Office   I discussed the limitations of evaluation and management by telemedicine and the availability of in person appointments. The patient expressed understanding and agreed to proceed.    History of Present Illness: Debbie Patel is a 38 y.o. who identifies as a female who was assigned female at birth, and is being seen today for dysuria.  HPI: Urinary Tract Infection  This is a new problem. The current episode started in the past 7 days. The problem occurs every urination. The quality of the pain is described as burning. The pain is moderate. There has been no fever. Associated symptoms include frequency, hesitancy and urgency. Pertinent negatives include no chills, discharge, flank pain, hematuria, nausea, possible pregnancy or sweats. Associated symptoms comments: Stronger urine odor, suprapubic pain. She has tried increased fluids for the symptoms. The treatment provided no relief.     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 09/02/2021   History of Hashimoto thyroiditis 10/31/2019   Hypothyroidism 12/03/2016   Fracture of left foot 10/17/2012    Allergies:  Allergies  Allergen Reactions   Nickel Rash and Hives   Medications:  Current Outpatient Medications:    fluconazole  (DIFLUCAN ) 150 MG tablet, Take 1 tablet (150 mg total) by mouth every 3 (three) days  as needed., Disp: 2 tablet, Rfl: 0   nitrofurantoin , macrocrystal-monohydrate, (MACROBID ) 100 MG capsule, Take 1 capsule (100 mg total) by mouth 2 (two) times daily., Disp: 10 capsule, Rfl: 0   acetaminophen  (TYLENOL ) 500 MG tablet, Take 2 tablets (1,000 mg total) by mouth every 6 (six) hours., Disp: 30 tablet, Rfl: 0   albuterol   (VENTOLIN  HFA) 108 (90 Base) MCG/ACT inhaler, Inhale 2 puffs into the lungs every 4 (four) hours as needed for wheezing or shortness of breath., Disp: 8.5 g, Rfl: 2   Albuterol -Budesonide  (AIRSUPRA ) 90-80 MCG/ACT AERO, Inhale 2 puffs into the lungs as needed (wheezing/shortness of breath). maximum 12 puffs/day, Disp: 10.7 g, Rfl: 2   Azelastine -Fluticasone  (DYMISTA ) 137-50 MCG/ACT SUSP, Place 1 spray into both nostrils 2 (two) times daily as needed., Disp: 23 g, Rfl: 5   budesonide -formoterol  (SYMBICORT ) 160-4.5 MCG/ACT inhaler, Inhale 2 puffs into the lungs 2 (two) times daily., Disp: 1 each, Rfl: 5   EPINEPHrine  (EPIPEN  2-PAK) 0.3 mg/0.3 mL IJ SOAJ injection, Inject 0.3 mg into the muscle as needed for anaphylaxis. Bring to your allergy  injection appointments., Disp: 2 each, Rfl: 2   famotidine  (PEPCID ) 20 MG tablet, Take 1 tab twice daily Monday and Tuesday before [facility] appt., Disp: 4 tablet, Rfl: 0   fluconazole  (DIFLUCAN ) 150 MG tablet, Take 1 tablet (150 mg total) by mouth every 3 (three) days., Disp: 3 tablet, Rfl: 0   ibuprofen  (ADVIL ) 600 MG tablet, Take 1 tablet (600 mg total) by mouth every 6 (six) hours., Disp: 30 tablet, Rfl: 0   levocetirizine (XYZAL ) 5 MG tablet, Take 1 tab Monday and Tuesday morning before [facility] appt., Disp: 2 tablet, Rfl: 0   meloxicam (MOBIC) 15 MG tablet, Take 1 tab daily for 7 days, then take as needed., Disp: , Rfl:    montelukast  (SINGULAIR ) 10 MG tablet, Take 1 tablet (10 mg total) by mouth at bedtime., Disp: 90 tablet, Rfl: 1   nystatin  (MYCOSTATIN ) 100000 UNIT/ML suspension, Take 5 mLs (500,000 Units total) by mouth 4 (four) times daily., Disp: 473 mL, Rfl: 0   predniSONE  (DELTASONE ) 20 MG tablet, Take 2 tablets daily with breakfast., Disp: 10 tablet, Rfl: 0   scopolamine  (TRANSDERM-SCOP) 1 MG/3DAYS, Place 1 patch (1.5 mg total) onto the skin every 3 (three) days., Disp: 3 patch, Rfl: 0   SYNTHROID  200 MCG tablet, Take 200-300 mcg by mouth daily. Taking 200mcg  daily except on Sunday taking 300mcg, Disp: , Rfl:   Observations/Objective: Patient is well-developed, well-nourished in no acute distress.  Resting comfortably at home.  Head is normocephalic, atraumatic.  No labored breathing.  Speech is clear and coherent with logical content.  Patient is alert and oriented at baseline.    Assessment and Plan: 1. Suspected UTI (Primary) - nitrofurantoin , macrocrystal-monohydrate, (MACROBID ) 100 MG capsule; Take 1 capsule (100 mg total) by mouth 2 (two) times daily.  Dispense: 10 capsule; Refill: 0  2. Antibiotic-induced yeast infection - fluconazole  (DIFLUCAN ) 150 MG tablet; Take 1 tablet (150 mg total) by mouth every 3 (three) days as needed.  Dispense: 2 tablet; Refill: 0  - Worsening symptoms.  - Will treat empirically with Macrobid  - May use AZO for bladder spasms - Continue to push fluids.  - Diflucan  given as prophylaxis as patient tends to get vaginal yeast infections with antibiotic use and recently has had thrush from inhaler and prednisone  for allergic asthma and bronchitis - Seek in person evaluation for urine culture if symptoms do not improve or if they worsen.  Follow Up Instructions: I discussed the assessment and treatment plan with the patient. The patient was provided an opportunity to ask questions and all were answered. The patient agreed with the plan and demonstrated an understanding of the instructions.  A copy of instructions were sent to the patient via MyChart unless otherwise noted below.    The patient was advised to call back or seek an in-person evaluation if the symptoms worsen or if the condition fails to improve as anticipated.    [name], PA-C

## 2024-05-07 ENCOUNTER — Ambulatory Visit (INDEPENDENT_AMBULATORY_CARE_PROVIDER_SITE_OTHER): Payer: Self-pay

## 2024-05-07 DIAGNOSIS — J309 Allergic rhinitis, unspecified: Secondary | ICD-10-CM

## 2024-05-08 LAB — HM MAMMOGRAPHY

## 2024-05-10 ENCOUNTER — Encounter: Payer: Self-pay | Admitting: Family

## 2024-05-15 ENCOUNTER — Ambulatory Visit (INDEPENDENT_AMBULATORY_CARE_PROVIDER_SITE_OTHER): Payer: Self-pay

## 2024-05-15 DIAGNOSIS — J309 Allergic rhinitis, unspecified: Secondary | ICD-10-CM

## 2024-05-22 ENCOUNTER — Encounter: Payer: Self-pay | Admitting: Internal Medicine

## 2024-05-22 ENCOUNTER — Ambulatory Visit: Admitting: Internal Medicine

## 2024-05-22 VITALS — BP 112/78 | HR 87 | Wt 209.8 lb

## 2024-05-22 DIAGNOSIS — J302 Other seasonal allergic rhinitis: Secondary | ICD-10-CM | POA: Diagnosis not present

## 2024-05-22 DIAGNOSIS — J4540 Moderate persistent asthma, uncomplicated: Secondary | ICD-10-CM

## 2024-05-22 DIAGNOSIS — J3089 Other allergic rhinitis: Secondary | ICD-10-CM | POA: Diagnosis not present

## 2024-05-22 DIAGNOSIS — R053 Chronic cough: Secondary | ICD-10-CM | POA: Diagnosis not present

## 2024-05-22 DIAGNOSIS — L308 Other specified dermatitis: Secondary | ICD-10-CM

## 2024-05-22 MED ORDER — PANTOPRAZOLE SODIUM 40 MG PO TBEC: 40.0000 mg | DELAYED_RELEASE_TABLET | Freq: Every day | ORAL | 2 refills | Status: DC

## 2024-05-22 MED ORDER — AZELASTINE-FLUTICASONE 137-50 MCG/ACT NA SUSP: 1.0000 | Freq: Two times a day (BID) | NASAL | 5 refills | Status: DC | PRN

## 2024-05-22 MED ORDER — AMITRIPTYLINE HCL 25 MG PO TABS: 25.0000 mg | ORAL_TABLET | Freq: Every day | ORAL | 1 refills | Status: DC

## 2024-05-22 MED ORDER — MONTELUKAST SODIUM 10 MG PO TABS: 10.0000 mg | ORAL_TABLET | Freq: Every day | ORAL | 1 refills | Status: DC

## 2024-05-22 MED ORDER — BUDESONIDE-FORMOTEROL FUMARATE 160-4.5 MCG/ACT IN AERO: 2.0000 | INHALATION_SPRAY | Freq: Two times a day (BID) | RESPIRATORY_TRACT | 5 refills | Status: DC

## 2024-05-22 MED ORDER — AIRSUPRA 90-80 MCG/ACT IN AERO: 2.0000 | INHALATION_SPRAY | RESPIRATORY_TRACT | 2 refills | Status: DC | PRN

## 2024-05-22 NOTE — Patient Instructions (Addendum)
 Mild Persistent Asthma  - Controller Inhaler: Continue Symbicort  160 mcg 2 puffs twice a day; This Should Be Used Everyday. Will replace Symbicort  80. - Rinse mouth out after use -use with spacer. - During respiratory illness or asthma flares: Increase symbicort  160 mcg 3 puffs twice daily  and continue for 2 weeks or until symptoms resolve. - Rescue Inhaler: Airsupra  2 puffs. Use  every 4-6 hours as needed for chest tightness, wheezing, or coughing.   Can also use 15 minutes prior to exercise if you have symptoms with activity. - Asthma is not controlled if:  - Symptoms are occurring >2 times a week OR  - >2 times a month nighttime awakenings  - You are requiring systemic steroids (prednisone /steroid injections) more than once per year  - Your require hospitalization for your asthma.  - Please call the clinic to schedule a follow up if these symptoms arise Avoid smoke exposure Stay up-to-date with your annual flu vaccines, COVID vaccines and pneumonia vaccines when indicated.  Seasonal and perennial allergic rhinitis: Chronic symptoms managed with Zyrtec, Singulair , and Flonase. Recent increase in eye redness. - Dymista  (one spray each nostril twice a day as needed). -Consider Pataday or Zatidor eye drops for eye symptoms. -Continue Zyrtec 10 mg daily as needed -Continue Singulair  10 mg nightly -Allergy  testing 10/31/23 positive to rye grass, pigweed, red cedar and sycamore trees, Alternaria-outdoor mold, Aspergillus-indoor mold, horse and cockroach  IDs positive to grass pollen, ragweed, mold mix 3 and 4, dust mites, cat, dog Allergen avoidance. -continue allergy  injections, currently in build-up; continue to bring your epinephrine  autoinjector to your appointments.  Eczema History of dry patches on elbow and leg, managed with moisturizers. -Continue current management.   Upper Airway Cough Syndrome:  - chronic throat clearing hand out below - start amitriptyline  25 mg nightly;  will cause drowsiness - continue allergy  and asthma medications as above - start protonix  40 mg daily for 6-8 weeks then stop    Follow up : 6-8 weeks, sooner if needed.  It was a pleasure seeing you again in clinic today! Thank you for allowing me to participate in your care.  Rocky Endow, MD [facility]  CHRONIC THROAT CLEARING-WHAT TO DO!! Causes of chronic throat clearing may include the following (among others):  Acid reflux (lanyngopharyngeal reflux) Allergies (pollens, pet dander, dust mites, etc) Non allergic rhinitis (runny nose, post nasal drainage or congestion NOT caused by allergies-can be secondary to pollutants, cold air, changes in blood vessels to the nose as we age,etc) Environmental irritants (tobacco, smoke, air pollution) Asthma If present for a long period of time, throat clearing can become a habit. We will work with you to treat any of the medical reasons for throat clearing.  Your job is to help prevent the habit, which can cause damage (redness and swelling) to your vocal cords.  It will require a conscious effort on your behalf.  Tips for prevention of throat clearing:  Instead of clearing your throat, swallow instead.   Carrying around water (or something to drink) will help you move the mucus in the right direction.  IF you have the urge to clear your throat, drink your water. If you absolutely have to clear your throat, use a non-traumatic exercise to do so.   Pant with your mouth open saying "[place], [place], [place]" with a powerful, breathy voice. Increase water intake.  This thins secretions, making them easier to swallow. Chew baking soda gum ([employer]) which  can help with swallowing, reflux, and throat clearing.  Try to chew up to three times daily.   If you experience jaw pain or headaches, decrease the amount of chewing. Suck on sugar free hard candy to help with swallowing. Have your friends and family remind you to swallow when they  hear you throat clearing.  As this can be habit forming, sometimes you may not realize you are doing this.  Having someone point it out to you, will help you become more conscious of the behavior. BE PATIENT.  This will take time to resolve, and some do not see improvement until 8-12 weeks into therapy/behavior modifications.

## 2024-05-22 NOTE — Progress Notes (Signed)
 FOLLOW UP Date of Service/Encounter:   05/22/2024  Subjective:  Debbie Patel (DOB: 12-Nov-1985) is a 38 y.o. female who [facility] in re-evaluation of the following: Acute visit for chronic cough History obtained from: chart review and patient.  For Review, LV was on 04/16/24  with Dr. Lorin seen for [place]. See below for summary of history and diagnostics.   ----------------------------------------------------- Pertinent History/Diagnostics:  Chronic cough-history suggestive of asthma. Chronic cough, particularly after viral illnesses and during allergy  seasons. Recent episode of dyspnea. Improvement with albuterol  and oral prednisone . Borderline low pulmonary function test results. -Plan:Symbicort  160 (switched 01/2024) Seasonal and perennial allergic rhinitis Chronic symptoms managed with Zyrtec, Singulair , and Flonase. Recent increase in eye redness. -Plan: Dymista  (one spray each nostril twice a day as needed), Pataday or Zatidor eye drops for eye symptoms., Zyrtec 10 mg daily as needed, Singulair  10 mg nightly -Allergy  testing 10/31/23 positive to rye grass, pigweed, red cedar and sycamore trees, Alternaria-outdoor mold, Aspergillus-indoor mold, horse and cockroach  IDs positive to grass pollen, ragweed, mold mix 3 and 4, dust mites, cat, dog -Rash AIT started 04/16/2024. --------------------------------------------------- Today presents for follow-up. She continues to have intermittent cough. Coughing spells are significant.  She does have phlegm with coughing.  It is impeding parts of her life.  She is unable to sleep with her spouse some nights due to coughing and sleep disturbance She ws prescribed prednisone  by an UC in August, but she believes she had a virus at this point.  She does think the prednisone  was helpful. The coughing she feels is coming from her throat. She is taking zyrtec and singulair  daily. She is using nasal  spray dymista  1 spray sometimes up to twice daily. She is using Symbicort  160 2 puffs BID. She has been using albuterol  more regularly since current coughing spell which started last week and and will get a brief improvement in symptoms, but then they return. She feels she has a tickle in her throat that precedes the cough. She does report having bad covid years ago, requiring bed rest for 2 weeks and following this her coughing spells started.  She denies current acid reflux. She has been on protonix  in the past and did have it during pregnancy, but has not been treated outside of that.  Cough is always worse at night.  The minute she lays down, coughing starts. Cough seems to trigger mucus production and not the other way around.  She is throat clearing a lot. Her throat felt dry when current coughing spell started. She does not feel short of breath. Air hitting throat the wrong way will cause her to cough. She is drinking a large water container 3 times per day.  She is tearful throughout encounter due to negative impact on quality of life.  Chart Review: Seen for bronchitis on 04/27/24 at [facility] and prescribed prednisone .   All medications reviewed by clinical staff and updated in chart. No new pertinent medical or surgical history except as noted in HPI.  ROS: All others negative except as noted per HPI.   Objective:  BP 112/78 (BP Location: Right Arm, Patient Position: Sitting)   Pulse 87   Wt 209 lb 12.8 oz (95.2 kg)   LMP 04/21/2024   SpO2 100%   BMI 29.26 kg/m  Body mass index is 29.26 kg/m. Physical Exam: General Appearance:  Alert, cooperative, no distress, appears stated age  Head:  Normocephalic, without obvious abnormality, atraumatic  Eyes:  Conjunctiva clear, EOM's intact  Ears EACs normal bilaterally and normal TMs bilaterally  Nose: Nares normal, hypertrophic turbinates, normal mucosa, and no visible anterior polyps  Throat: Lips, tongue normal; teeth and gums  normal, normal posterior oropharynx  Neck: Supple, symmetrical  Lungs:   clear to auscultation bilaterally, Respirations unlabored, intermittent dry coughing  Heart:  regular rate and rhythm and no murmur, Appears well perfused  Extremities: No edema  Skin: Skin color, texture, turgor normal and no rashes or lesions on visualized portions of skin  Neurologic: No gross deficits   Labs:  Lab Orders  No laboratory test(s) ordered today    Spirometry:  Tracings reviewed. Her effort: Good reproducible efforts. FVC: 3.55L FEV1: 2.97L, 78% predicted FEV1/FVC ratio: 0.84 Interpretation: Nonobstructive ratio, low FEV1, possible restriction.  Please see scanned spirometry results for details.  Assessment/Plan   Discussed that current symptoms are more likely upper airway cough syndrome.  Agree that her asthma is controlled.  Spirometry look good today.  Will continue her asthma medications.  Discussed that chronic cough is usually multifactorial with things such as drainage coming from allergies or other causes, acid reflux, and neurogenic causes as well as other reasons for throat inflammation including chronic throat clearing can lead to cough.  We will continue to address allergies and asthma.  We will add a treatment for acid reflux, and we will add a treatment for neurogenic cough.  We discussed behavioral strategies for chronic cough throat clearing.  Mild Persistent Asthma/at goal - Controller Inhaler: Continue Symbicort  160 mcg 2 puffs twice a day; This Should Be Used Everyday. Will replace Symbicort  80. - Rinse mouth out after use -use with spacer. - During respiratory illness or asthma flares: Increase symbicort  160 mcg 3 puffs twice daily  and continue for 2 weeks or until symptoms resolve. - Rescue Inhaler: Airsupra  2 puffs. Use  every 4-6 hours as needed for chest tightness, wheezing, or coughing.   Can also use 15 minutes prior to exercise if you have symptoms with activity. -  Asthma is not controlled if:  - Symptoms are occurring >2 times a week OR  - >2 times a month nighttime awakenings  - You are requiring systemic steroids (prednisone /steroid injections) more than once per year  - Your require hospitalization for your asthma.  - Please call the clinic to schedule a follow up if these symptoms arise Avoid smoke exposure Stay up-to-date with your annual flu vaccines, COVID vaccines and pneumonia vaccines when indicated.  Seasonal and perennial allergic rhinitis: Not at goal but improved on AIT Chronic symptoms managed with Zyrtec, Singulair , and Flonase. Recent increase in eye redness. - Dymista  (one spray each nostril twice a day as needed). -Consider Pataday or Zatidor eye drops for eye symptoms. -Continue Zyrtec 10 mg daily as needed -Continue Singulair  10 mg nightly -Allergy  testing 10/31/23 positive to rye grass, pigweed, red cedar and sycamore trees, Alternaria-outdoor mold, Aspergillus-indoor mold, horse and cockroach  IDs positive to grass pollen, ragweed, mold mix 3 and 4, dust mites, cat, dog Allergen avoidance. -continue allergy  injections, currently in build-up; continue to bring your epinephrine  autoinjector to your appointments.  Eczema-at goal History of dry patches on elbow and leg, managed with moisturizers. -Continue current management.   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  It was a pleasure seeing you again in clinic today! Thank you for allowing me to participate in your care.  Rocky Endow, MD [facility]  Other: allergy  injection given in clinic today  Rocky Endow, MD  [facility] [place] 

## 2024-05-23 ENCOUNTER — Encounter: Payer: Self-pay | Admitting: Internal Medicine

## 2024-05-24 NOTE — Addendum Note (Signed)
 Addended by: NORA SAM on: 05/24/2024 04:02 PM   Modules accepted: Orders

## 2024-05-27 ENCOUNTER — Telehealth: Payer: Self-pay | Admitting: *Deleted

## 2024-05-27 ENCOUNTER — Telehealth: Payer: Self-pay

## 2024-05-27 MED ORDER — BUDESONIDE-FORMOTEROL FUMARATE 160-4.5 MCG/ACT IN AERO: 2.0000 | INHALATION_SPRAY | Freq: Two times a day (BID) | RESPIRATORY_TRACT | 3 refills | Status: DC

## 2024-05-27 NOTE — Telephone Encounter (Signed)
 Pt called to advise insurance will not cover the Symbicort  because it was not a 90 day supply. Resent 90 day supply to [facility] on [address]

## 2024-05-27 NOTE — Addendum Note (Signed)
 Addended by: GRETEL KRABBE on: 05/27/2024 10:48 AM   Modules accepted: Orders

## 2024-05-27 NOTE — Telephone Encounter (Signed)
 Received fax from [facility] that Symbicort  160 mcg needs a PA for a 90 day supply. Insurance otherwise will only pay for 1 inhaler every 67 days.

## 2024-05-28 ENCOUNTER — Other Ambulatory Visit (HOSPITAL_COMMUNITY): Payer: Self-pay

## 2024-05-28 MED ORDER — BUDESONIDE-FORMOTEROL FUMARATE 160-4.5 MCG/ACT IN AERO: 2.0000 | INHALATION_SPRAY | Freq: Two times a day (BID) | RESPIRATORY_TRACT | 1 refills | Status: DC

## 2024-05-28 NOTE — Telephone Encounter (Signed)
90 day supply has been sent.

## 2024-05-28 NOTE — Addendum Note (Signed)
 Addended by: ONEITA CHRISTIANS D on: 05/28/2024 02:36 PM   Modules accepted: Orders

## 2024-05-30 ENCOUNTER — Ambulatory Visit: Payer: Self-pay

## 2024-05-30 DIAGNOSIS — J309 Allergic rhinitis, unspecified: Secondary | ICD-10-CM | POA: Diagnosis not present

## 2024-06-04 ENCOUNTER — Encounter: Payer: Self-pay | Admitting: Internal Medicine

## 2024-06-05 ENCOUNTER — Ambulatory Visit: Payer: Self-pay

## 2024-06-05 DIAGNOSIS — J309 Allergic rhinitis, unspecified: Secondary | ICD-10-CM | POA: Diagnosis not present

## 2024-06-12 ENCOUNTER — Ambulatory Visit: Payer: Self-pay

## 2024-06-12 DIAGNOSIS — J309 Allergic rhinitis, unspecified: Secondary | ICD-10-CM

## 2024-06-13 ENCOUNTER — Other Ambulatory Visit: Payer: Self-pay | Admitting: Internal Medicine

## 2024-06-18 ENCOUNTER — Ambulatory Visit: Admitting: Family

## 2024-06-18 ENCOUNTER — Telehealth: Payer: Self-pay | Admitting: Family

## 2024-06-18 VITALS — BP 98/57 | HR 73 | Temp 98.7°F | Resp 16 | Ht 71.0 in | Wt 210.0 lb

## 2024-06-18 DIAGNOSIS — J4540 Moderate persistent asthma, uncomplicated: Secondary | ICD-10-CM

## 2024-06-18 DIAGNOSIS — Z23 Encounter for immunization: Secondary | ICD-10-CM

## 2024-06-18 DIAGNOSIS — R059 Cough, unspecified: Secondary | ICD-10-CM | POA: Diagnosis not present

## 2024-06-18 DIAGNOSIS — J302 Other seasonal allergic rhinitis: Secondary | ICD-10-CM

## 2024-06-18 DIAGNOSIS — E039 Hypothyroidism, unspecified: Secondary | ICD-10-CM | POA: Diagnosis not present

## 2024-06-18 LAB — TSH: TSH: 0.05 u[IU]/mL — ABNORMAL LOW (ref 0.35–5.50)

## 2024-06-18 NOTE — Assessment & Plan Note (Signed)
 Now doing allergy  shots.

## 2024-06-18 NOTE — Assessment & Plan Note (Signed)
 Resolved with elavil  that was rx'd by Allergist.

## 2024-06-18 NOTE — Assessment & Plan Note (Signed)
 Seems better controlled on allergy  shots. She is now down to one puff on her symbicort  which seems to be holding her.

## 2024-06-18 NOTE — Telephone Encounter (Signed)
 Please request mammo from [facility] and pap from [facility]

## 2024-06-18 NOTE — Assessment & Plan Note (Signed)
 Update TSH, continue synthroid .

## 2024-06-18 NOTE — Progress Notes (Unsigned)
 Subjective:     Patient ID: Debbie Patel, female    DOB: [birth year], 38 y.o.   MRN: [id number]  Chief Complaint  Patient presents with   Hypothyroidism    Here for follow up    HPI  Discussed the use of AI scribe software for clinical note transcription with the patient, who gave verbal consent to proceed.  History of Present Illness  Debbie Patel is a 38 year old female with asthma and allergies who presents for a follow-up. Her thyroid  management remains a concern due to a previously low TSH, though her last check in February at her Endocrinologist was within range. She continues on a regimen of 200 micrograms daily with a half dose on Sundays.   She experiences severe allergy  symptoms, primarily due to pet dander, and has been undergoing allergy  shots for two to three months with significant improvement, especially in her chronic cough. Amitriptyline  at night has nearly eliminated her cough. She was on Protonix  for six weeks to rule out acid reflux. She aims to reduce inhaler use, having decreased Symbicort  to one puff without a return of her cough.   She is concerned about recent weight gain, particularly in the abdominal area, despite regular exercise. Her periods are regular, and she is not on birth control as her husband had a vasectomy two years ago.      Health Maintenance Due  Topic Date Due   Hepatitis C Screening  Never done   Pneumococcal Vaccine (1 of 2 - PCV) Never done   Cervical Cancer Screening (HPV/Pap Cotest)  Never done   COVID-19 Vaccine (4 - 2025-26 season) 04/29/2024    Past Medical History:  Diagnosis Date   Eczema    Eustachian tube dysfunction    Hashimoto's thyroiditis    Hypothyroidism    Nontoxic multinodular goiter    Urticaria     Past Surgical History:  Procedure Laterality Date   CHOLECYSTECTOMY  05/29/2017   patient reports   CYSTECTOMY Left    left wrist ganglion cyst   THYROIDECTOMY  08/20/2012   WISDOM TOOTH  EXTRACTION      Family History  Problem Relation Age of Onset   Stroke Mother    Esophageal cancer Father        heavy smoker and drink   Tremor Brother    Drug abuse Brother    CVA Maternal Grandmother    Alzheimer's disease Maternal Grandfather    Alzheimer's disease Paternal Grandmother    CAD Paternal Grandfather    Allergic rhinitis Neg Hx    Asthma Neg Hx    Eczema Neg Hx    Urticaria Neg Hx     Social History   Socioeconomic History   Marital status: Single    Spouse name: Not on file   Number of children: Not on file   Years of education: Not on file   Highest education level: Not on file  Occupational History   Not on file  Tobacco Use   Smoking status: Never    Passive exposure: Never   Smokeless tobacco: Never  Vaping Use   Vaping status: Never Used  Substance and Sexual Activity   Alcohol use: Yes    Comment: occasionally   Drug use: No   Sexual activity: Yes    Partners: Male    Comment: husband-vasectomy  Other Topics Concern   Not on file  Social History Narrative   Mom is [place] Console   Has 2 children born  2023 (daughter)   2018 (son)   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 : Never true  Transportation Needs: No Transportation Needs (05/23/2023)   Received from [facility]    In the past 12 months, has lack of reliable transportation kept you from medical appointments, meetings, work or from getting  things needed for daily living? : No  Physical Activity: Not on file  Stress: Not on file  Social Connections: Unknown (07/09/2022)   Received from [facility]   Social Network    Social Network: Not on file  Intimate Partner Violence: Unknown (07/09/2022)   Received from [facility]   HITS    Physically Hurt: Not on file    Insult or Talk Down To: Not on file    Threaten Physical Harm: Not on file    Scream or Curse: Not on file    Outpatient Medications Prior to Visit  Medication Sig Dispense Refill   acetaminophen  (TYLENOL ) 500 MG tablet Take 2 tablets (1,000 mg total) by mouth every 6 (six) hours. 30 tablet 0   albuterol  (VENTOLIN  HFA) 108 (90 Base) MCG/ACT inhaler Inhale 2 puffs into the lungs every 4 (four) hours as needed for wheezing or shortness of breath. 8.5 g 2   amitriptyline  (ELAVIL ) 25 MG tablet TAKE 1 TABLET BY MOUTH EVERYDAY AT BEDTIME 90 tablet 1   Azelastine -Fluticasone  (DYMISTA ) 137-50 MCG/ACT SUSP Place 1 spray into both nostrils 2 (two) times daily as needed. 23 g 5   budesonide -formoterol  (SYMBICORT ) 160-4.5 MCG/ACT inhaler Inhale 2 puffs into the lungs 2 (two) times daily. 30.6 g 1   EPINEPHrine  (EPIPEN  2-PAK) 0.3 mg/0.3 mL IJ SOAJ injection Inject 0.3 mg into the muscle as needed for anaphylaxis. Bring to your allergy  injection appointments. 2 each 2   ibuprofen  (ADVIL ) 600 MG tablet Take 1 tablet (600 mg total) by mouth every 6 (six) hours. 30 tablet 0   montelukast  (SINGULAIR ) 10 MG tablet Take 1 tablet (10 mg total) by mouth at bedtime. 90 tablet 1   pantoprazole  (PROTONIX ) 40 MG tablet Take 1 tablet (40 mg total) by mouth daily. 30 tablet 2   SYNTHROID  200 MCG tablet Taking 200mcg daily except on Sunday taking 100mcg     Albuterol -Budesonide  (AIRSUPRA ) 90-80 MCG/ACT AERO Inhale 2 puffs into the lungs as needed (wheezing/shortness of breath). maximum 12 puffs/day 10.7 g 2   fluconazole  (DIFLUCAN ) 150 MG tablet Take 1 tablet (150 mg total) by mouth every 3  (three) days. 3 tablet 0   fluconazole  (DIFLUCAN ) 150 MG tablet Take 1 tablet (150 mg total) by mouth every 3 (three) days as needed. 2 tablet 0   meloxicam (MOBIC) 15 MG tablet Take 1 tab daily for 7 days, then take as needed.     nitrofurantoin , macrocrystal-monohydrate, (MACROBID ) 100 MG capsule Take 1 capsule (100 mg total) by mouth 2 (two) times daily.  10 capsule 0   nystatin  (MYCOSTATIN ) 100000 UNIT/ML suspension Take 5 mLs (500,000 Units total) by mouth 4 (four) times daily. 473 mL 0   scopolamine  (TRANSDERM-SCOP) 1 MG/3DAYS Place 1 patch (1.5 mg total) onto the skin every 3 (three) days. 3 patch 0   No facility-administered medications prior to visit.    Allergies  Allergen Reactions   Nickel Rash and Hives    ROS See HPI    Objective:    Physical Exam Constitutional:      General: She is not in acute distress.    Appearance: Normal appearance. She is well-developed.  HENT:     Head: Normocephalic and atraumatic.     Right Ear: External ear normal.     Left Ear: External ear normal.  Eyes:     General: No scleral icterus. Neck:     Thyroid : No thyromegaly.  Cardiovascular:     Rate and Rhythm: Normal rate and regular rhythm.     Heart sounds: Normal heart sounds. No murmur heard. Pulmonary:     Effort: Pulmonary effort is normal. No respiratory distress.     Breath sounds: Normal breath sounds. No wheezing.  Musculoskeletal:     Cervical back: Neck supple.  Skin:    General: Skin is warm and dry.  Neurological:     Mental Status: She is alert and oriented to person, place, and time.  Psychiatric:        Mood and Affect: Mood normal.        Behavior: Behavior normal.        Thought Content: Thought content normal.        Judgment: Judgment normal.      BP (!) 98/57 (BP Location: Right Arm, Patient Position: Sitting, Cuff Size: Large)   Pulse 73   Temp 98.7 F (37.1 C) (Oral)   Resp 16   Ht 5' 11 (1.803 m)   Wt 210 lb (95.3 kg)   SpO2 99%   BMI  29.29 kg/m  Wt Readings from Last 3 Encounters:  06/18/24 210 lb (95.3 kg)  05/22/24 209 lb 12.8 oz (95.2 kg)  03/27/24 207 lb 6.4 oz (94.1 kg)       Assessment & Plan:   Problem List Items Addressed This Visit       Unprioritized   Seasonal and perennial allergic rhinitis - Primary   Now doing allergy  shots.       Moderate persistent asthma without complication   Seems better controlled on allergy  shots. She is now down to one puff on her symbicort  which seems to be holding her.       Hypothyroidism   Update TSH, continue synthroid .       Relevant Orders   TSH   Cough   Resolved with elavil  that was rx'd by Allergist.      Other Visit Diagnoses       Need for pneumococcal 20-valent conjugate vaccination       Relevant Orders   Pneumococcal conjugate vaccine 20-valent (Prevnar 20)       I have discontinued Kamaree Berkel KATIE's scopolamine , meloxicam, nystatin , fluconazole , nitrofurantoin  (macrocrystal-monohydrate), fluconazole , and Airsupra . I am also having her maintain her Synthroid , acetaminophen , ibuprofen , albuterol , EPINEPHrine , pantoprazole , Azelastine -Fluticasone , montelukast , budesonide -formoterol , and amitriptyline .  No orders of the defined types were placed in this encounter.

## 2024-06-18 NOTE — Telephone Encounter (Signed)
Electronic request sent for both

## 2024-06-18 NOTE — Patient Instructions (Signed)
 VISIT SUMMARY:  During your visit, we discussed your thyroid  management, allergy  treatment, asthma, and recent weight gain. We also reviewed your general health maintenance and vaccinations.  YOUR PLAN:  ASTHMA WITH ALLERGIC RHINITIS AND MULTIPLE ENVIRONMENTAL ALLERGIES: Your asthma and allergies have improved with allergy  shots and amitriptyline . You have also reduced your inhaler use. -Continue with your allergy  shots. -Keep taking amitriptyline  for your chronic cough. -Reduce Symbicort  to one puff daily. -You will receive a pneumonia vaccine due to your asthma.  CHRONIC COUGH: Your chronic cough has improved with amitriptyline , which is likely nerve-related. -Continue taking amitriptyline  for your chronic cough.  HYPOTHYROIDISM: Your thyroid  levels were normal in February, but we need to check them again to ensure proper management. -We will order a thyroid  function test to assess your current levels. -We will ensure your dosing is correct in your medical records.  WEIGHT GAIN AND ABDOMINAL ADIPOSITY: Your recent weight gain, especially around the abdomen, may be related to hormonal changes or thyroid  dysfunction. -We will evaluate your thyroid  function as a potential contributor to your weight gain.  ECZEMA: You have occasional flare-ups of eczema.  GENERAL HEALTH MAINTENANCE: We discussed your overall health, vaccinations, and screenings. -You will receive a pneumonia vaccine. -We recommend getting the COVID-19 vaccine, which is available at local pharmacies. -Schedule a physical examination after your insurance eligibility date.

## 2024-06-19 ENCOUNTER — Ambulatory Visit: Payer: Self-pay | Admitting: Family

## 2024-06-20 ENCOUNTER — Ambulatory Visit (INDEPENDENT_AMBULATORY_CARE_PROVIDER_SITE_OTHER): Payer: Self-pay

## 2024-06-20 DIAGNOSIS — J309 Allergic rhinitis, unspecified: Secondary | ICD-10-CM

## 2024-06-28 ENCOUNTER — Ambulatory Visit (INDEPENDENT_AMBULATORY_CARE_PROVIDER_SITE_OTHER): Admitting: *Deleted

## 2024-06-28 DIAGNOSIS — J309 Allergic rhinitis, unspecified: Secondary | ICD-10-CM

## 2024-07-03 ENCOUNTER — Ambulatory Visit: Payer: Self-pay

## 2024-07-03 DIAGNOSIS — J309 Allergic rhinitis, unspecified: Secondary | ICD-10-CM

## 2024-07-11 ENCOUNTER — Ambulatory Visit (INDEPENDENT_AMBULATORY_CARE_PROVIDER_SITE_OTHER)

## 2024-07-11 DIAGNOSIS — J309 Allergic rhinitis, unspecified: Secondary | ICD-10-CM | POA: Diagnosis not present

## 2024-07-17 ENCOUNTER — Ambulatory Visit (INDEPENDENT_AMBULATORY_CARE_PROVIDER_SITE_OTHER)

## 2024-07-17 DIAGNOSIS — J309 Allergic rhinitis, unspecified: Secondary | ICD-10-CM

## 2024-07-23 ENCOUNTER — Ambulatory Visit: Payer: Self-pay

## 2024-07-23 DIAGNOSIS — J309 Allergic rhinitis, unspecified: Secondary | ICD-10-CM

## 2024-07-24 ENCOUNTER — Encounter: Admitting: Family

## 2024-07-31 ENCOUNTER — Ambulatory Visit

## 2024-07-31 ENCOUNTER — Encounter: Payer: Self-pay | Admitting: Internal Medicine

## 2024-07-31 ENCOUNTER — Ambulatory Visit: Admitting: Internal Medicine

## 2024-07-31 VITALS — BP 110/76 | HR 61 | Temp 98.3°F | Resp 18

## 2024-07-31 DIAGNOSIS — L308 Other specified dermatitis: Secondary | ICD-10-CM

## 2024-07-31 DIAGNOSIS — J309 Allergic rhinitis, unspecified: Secondary | ICD-10-CM

## 2024-07-31 DIAGNOSIS — J3089 Other allergic rhinitis: Secondary | ICD-10-CM

## 2024-07-31 DIAGNOSIS — R053 Chronic cough: Secondary | ICD-10-CM | POA: Diagnosis not present

## 2024-07-31 DIAGNOSIS — H1013 Acute atopic conjunctivitis, bilateral: Secondary | ICD-10-CM

## 2024-07-31 DIAGNOSIS — J4540 Moderate persistent asthma, uncomplicated: Secondary | ICD-10-CM | POA: Diagnosis not present

## 2024-07-31 DIAGNOSIS — J302 Other seasonal allergic rhinitis: Secondary | ICD-10-CM

## 2024-07-31 MED ORDER — AMITRIPTYLINE HCL 25 MG PO TABS: 25.0000 mg | ORAL_TABLET | Freq: Every day | ORAL | 1 refills | Status: AC

## 2024-07-31 MED ORDER — MONTELUKAST SODIUM 10 MG PO TABS: 10.0000 mg | ORAL_TABLET | Freq: Every day | ORAL | 1 refills | Status: AC

## 2024-07-31 MED ORDER — ALBUTEROL SULFATE HFA 108 (90 BASE) MCG/ACT IN AERS: 2.0000 | INHALATION_SPRAY | RESPIRATORY_TRACT | 2 refills | Status: AC | PRN

## 2024-07-31 MED ORDER — AZELASTINE-FLUTICASONE 137-50 MCG/ACT NA SUSP: 1.0000 | Freq: Two times a day (BID) | NASAL | 5 refills | Status: AC | PRN

## 2024-07-31 MED ORDER — BUDESONIDE-FORMOTEROL FUMARATE 160-4.5 MCG/ACT IN AERO: 2.0000 | INHALATION_SPRAY | Freq: Two times a day (BID) | RESPIRATORY_TRACT | 1 refills | Status: AC

## 2024-07-31 MED ORDER — AMITRIPTYLINE HCL 10 MG PO TABS: ORAL_TABLET | ORAL | 0 refills | Status: DC

## 2024-07-31 NOTE — Patient Instructions (Addendum)
 Mild Persistent Asthma - at goal Spirometry today looked great.  - Controller Inhaler: Continue Symbicort  160 mcg 2 puffs twice a day; This Should Be Used Everyday. Will replace Symbicort  80. - Rinse mouth out after use -use with spacer. - During respiratory illness or asthma flares: Increase symbicort  160 mcg 3 puffs twice daily  and continue for 2 weeks or until symptoms resolve. - Rescue Inhaler: Airsupra  2 puffs. Use  every 4-6 hours as needed for chest tightness, wheezing, or coughing.   Can also use 15 minutes prior to exercise if you have symptoms with activity. - Asthma is not controlled if:  - Symptoms are occurring >2 times a week OR  - >2 times a month nighttime awakenings  - You are requiring systemic steroids (prednisone /steroid injections) more than once per year  - Your require hospitalization for your asthma.  - Please call the clinic to schedule a follow up if these symptoms arise Avoid smoke exposure Stay up-to-date with your annual flu vaccines, COVID vaccines and pneumonia vaccines when indicated.  Seasonal and perennial allergic rhinitis: improved on AIT Chronic symptoms managed with Zyrtec, Singulair , and Flonase. Recent increase in eye redness. - Dymista  (one spray each nostril twice a day as needed). -Consider Pataday or Zatidor eye drops for eye symptoms. -Continue Zyrtec 10 mg daily as needed -Continue Singulair  10 mg nightly -Allergy  testing 10/31/23 positive to rye grass, pigweed, red cedar and sycamore trees, Alternaria-outdoor mold, Aspergillus-indoor mold, horse and cockroach  IDs positive to grass pollen, ragweed, mold mix 3 and 4, dust mites, cat, dog Allergen avoidance. -continue allergy  injections, currently in build-up; continue to bring your epinephrine  autoinjector to your appointments.  Eczema -at goal History of dry patches on elbow and leg, managed with moisturizers. -Continue current management.   Upper Airway Cough Syndrome: now resolved -  stop protonix , if remain cough free x 2-3 weeks, start to wean amitriptyline . If cough returns restart and try wean of amitriptyline .  - to wean amitriptyline : go to 20 mg nightly x 1 week, then 15 mg nightly x 1 week, then 10 mg nightly x 1 week then 5 mg nightly x 1 week then stop. 10 mg tablets have been sent for you to do the taper. - for now continue amitriptyline  25 mg nightly; will cause drowsiness - continue allergy  and asthma medications as above    Follow up : 12 weeks, sooner if needed.  It was a pleasure seeing you again in clinic today! Thank you for allowing me to participate in your care.  Rocky Endow, MD [facility]  CHRONIC THROAT CLEARING-WHAT TO DO!! Causes of chronic throat clearing may include the following (among others):  Acid reflux (lanyngopharyngeal reflux) Allergies (pollens, pet dander, dust mites, etc) Non allergic rhinitis (runny nose, post nasal drainage or congestion NOT caused by allergies-can be secondary to pollutants, cold air, changes in blood vessels to the nose as we age,etc) Environmental irritants (tobacco, smoke, air pollution) Asthma If present for a long period of time, throat clearing can become a habit. We will work with you to treat any of the medical reasons for throat clearing.  Your job is to help prevent the habit, which can cause damage (redness and swelling) to your vocal cords.  It will require a conscious effort on your behalf.  Tips for prevention of throat clearing:  Instead of clearing your throat, swallow instead.   Carrying around water (or something to drink) will help you move the mucus  in the right direction.  IF you have the urge to clear your throat, drink your water. If you absolutely have to clear your throat, use a non-traumatic exercise to do so.   Pant with your mouth open saying "[place], [place], [place]" with a powerful, breathy voice. Increase water intake.  This thins secretions, making them easier to  swallow. Chew baking soda gum ([employer] HAMMER) which can help with swallowing, reflux, and throat clearing.  Try to chew up to three times daily.   If you experience jaw pain or headaches, decrease the amount of chewing. Suck on sugar free hard candy to help with swallowing. Have your friends and family remind you to swallow when they hear you throat clearing.  As this can be habit forming, sometimes you may not realize you are doing this.  Having someone point it out to you, will help you become more conscious of the behavior. BE PATIENT.  This will take time to resolve, and some do not see improvement until 8-12 weeks into therapy/behavior modifications.

## 2024-07-31 NOTE — Progress Notes (Signed)
 FOLLOW UP Date of Service/Encounter:   07/31/2024  Subjective:  Debbie Patel (DOB: 10-14-1985) is a 38 y.o. female who [facility] in re-evaluation of the following: allergic rhinitis, asthma History obtained from: chart review and patient.  For Review, LV was on 05/22/24  with Dr.Sami Roes seen for acute visit for chronic cough. See below for summary of history and diagnostics.   Therapeutic plans/changes recommended: suspected upper airway cough and asthma considered controlled, FEV1 78%; we added amitriptyline  25 mg nightly and added protonix  40 mg daily x 6-8 weeks, ----------------------------------------------------- Pertinent History/Diagnostics:  Chronic cough-history suggestive of asthma. Chronic cough, particularly after viral illnesses and during allergy  seasons. Recent episode of dyspnea. Improvement with albuterol  and oral prednisone . Borderline low pulmonary function test results. -Plan:Symbicort  160 (switched 01/2024) Seasonal and perennial allergic rhinitis Chronic symptoms managed with Zyrtec, Singulair , and Flonase. Recent increase in eye redness. -Plan: Dymista  (one spray each nostril twice a day as needed), Pataday or Zatidor eye drops for eye symptoms., Zyrtec 10 mg daily as needed, Singulair  10 mg nightly -Allergy  testing 10/31/23 positive to rye grass, pigweed, red cedar and sycamore trees, Alternaria-outdoor mold, Aspergillus-indoor mold, horse and cockroach  IDs positive to grass pollen, ragweed, mold mix 3 and 4, dust mites, cat, dog -Rash AIT started 04/16/2024. --------------------------------------------------- Today presents for follow-up. Discussed the use of AI scribe software for clinical note transcription with the patient, who gave verbal consent to proceed.  History of Present Illness Debbie Patel is a 38 year old female who presents for follow-up of chronic cough and allergy   management.  Chronic cough - Previously persistent from summer into fall - Resolved completely - Currently asymptomatic - Taking Protonix , amitriptyline , and Symbicort  - infrequent use of rescue medication  Allergen immunotherapy reactions - Consistent development of large subcutaneous bumps following allergy  shots - Reactions occur approximately one and a half weeks after each injection - Lesions resemble bruises but resolve spontaneously over time - No post-injection icing of the area - no other systemic symptoms  Dietary modification - Reduced intake of processed sugar and gluten over the past month - Perceived improvement in overall condition attributed to dietary changes and allergen immunotherapy   Chart Review: Received 0.35 mL of red vial  All medications reviewed by clinical staff and updated in chart. No new pertinent medical or surgical history except as noted in HPI.  ROS: All others negative except as noted per HPI.   Objective:  BP 110/76   Pulse 61   Temp 98.3 F (36.8 C)   Resp 18   SpO2 99%  There is no height or weight on file to calculate BMI. Physical Exam: General Appearance:  Alert, cooperative, no distress, appears stated age  Head:  Normocephalic, without obvious abnormality, atraumatic  Eyes:  Conjunctiva clear, EOM's intact  Ears EACs normal bilaterally and normal TMs bilaterally  Nose: Nares normal, hypertrophic turbinates, normal mucosa, and no visible anterior polyps  Throat: Lips, tongue normal; teeth and gums normal, normal posterior oropharynx  Neck: Supple, symmetrical  Lungs:   clear to auscultation bilaterally, Respirations unlabored, no coughing  Heart:  regular rate and rhythm and no murmur, Appears well perfused  Extremities: No edema  Skin: Skin color, texture, turgor normal and no rashes or lesions on visualized portions of skin  Neurologic: No gross deficits   Labs:  Lab Orders  No laboratory test(s) ordered today     Spirometry:  Tracings reviewed. Her effort: Good reproducible  efforts. FVC: 3.61L FEV1: 3.12L, 83% predicted FEV1/FVC ratio: 0.86 Interpretation: Spirometry consistent with normal pattern.  Please see scanned spirometry results for details.   Assessment/Plan   Mild Persistent Asthma - at goal Spirometry today looked great.  - Controller Inhaler: Continue Symbicort  160 mcg 2 puffs twice a day; This Should Be Used Everyday. Will replace Symbicort  80. - Rinse mouth out after use -use with spacer. - During respiratory illness or asthma flares: Increase symbicort  160 mcg 3 puffs twice daily  and continue for 2 weeks or until symptoms resolve. - Rescue Inhaler: Airsupra  2 puffs. Use  every 4-6 hours as needed for chest tightness, wheezing, or coughing.   Can also use 15 minutes prior to exercise if you have symptoms with activity. - Asthma is not controlled if:  - Symptoms are occurring >2 times a week OR  - >2 times a month nighttime awakenings  - You are requiring systemic steroids (prednisone /steroid injections) more than once per year  - Your require hospitalization for your asthma.  - Please call the clinic to schedule a follow up if these symptoms arise Avoid smoke exposure Stay up-to-date with your annual flu vaccines, COVID vaccines and pneumonia vaccines when indicated.  Seasonal and perennial allergic rhinitis: improved on AIT Chronic symptoms managed with Zyrtec, Singulair , and Flonase. Recent increase in eye redness. - Dymista  (one spray each nostril twice a day as needed). -Consider Pataday or Zatidor eye drops for eye symptoms. -Continue Zyrtec 10 mg daily as needed -Continue Singulair  10 mg nightly -Allergy  testing 10/31/23 positive to rye grass, pigweed, red cedar and sycamore trees, Alternaria-outdoor mold, Aspergillus-indoor mold, horse and cockroach  IDs positive to grass pollen, ragweed, mold mix 3 and 4, dust mites, cat, dog Allergen avoidance. -continue  allergy  injections, currently in build-up; continue to bring your epinephrine  autoinjector to your appointments.  Eczema -at goal History of dry patches on elbow and leg, managed with moisturizers. -Continue current management.   Upper Airway Cough Syndrome: now resolved - stop protonix , if remain cough free x 2-3 weeks, start to wean amitriptyline . If cough returns restart and try wean of amitriptyline .  - to wean amitriptyline : go to 20 mg nightly x 1 week, then 15 mg nightly x 1 week, then 10 mg nightly x 1 week then 5 mg nightly x 1 week then stop. 10 mg tablets have been sent for you to do the taper. - for now continue amitriptyline  25 mg nightly; will cause drowsiness - continue allergy  and asthma medications as above    Follow up : 12 weeks, sooner if needed.  It was a pleasure seeing you again in clinic today! Thank you for allowing me to participate in your care.  Rocky Endow, MD [facility]  Other: allergy  injection provided today in clinic  Rocky Endow, MD  [facility] 

## 2024-08-02 NOTE — Addendum Note (Signed)
 Addended by: NORA SAM on: 08/02/2024 08:07 AM   Modules accepted: Orders

## 2024-08-06 ENCOUNTER — Ambulatory Visit

## 2024-08-06 DIAGNOSIS — J309 Allergic rhinitis, unspecified: Secondary | ICD-10-CM

## 2024-08-13 ENCOUNTER — Encounter: Payer: Self-pay | Admitting: Internal Medicine

## 2024-08-15 ENCOUNTER — Ambulatory Visit

## 2024-08-15 ENCOUNTER — Other Ambulatory Visit: Payer: Self-pay

## 2024-08-15 ENCOUNTER — Ambulatory Visit
Admission: RE | Admit: 2024-08-15 | Discharge: 2024-08-15 | Disposition: A | Discharge status: Home / Self Care | Source: Home / Self Care

## 2024-08-15 VITALS — BP 121/77 | HR 91 | Temp 98.3°F | Resp 15 | Wt 195.0 lb

## 2024-08-15 DIAGNOSIS — J101 Influenza due to other identified influenza virus with other respiratory manifestations: Secondary | ICD-10-CM | POA: Diagnosis not present

## 2024-08-15 LAB — POC COVID19/FLU A&B COMBO
Covid Antigen, POC: NEGATIVE
Influenza A Antigen, POC: POSITIVE — AB
Influenza B Antigen, POC: NEGATIVE

## 2024-08-15 MED ORDER — OSELTAMIVIR PHOSPHATE 75 MG PO CAPS: 75.0000 mg | ORAL_CAPSULE | Freq: Two times a day (BID) | ORAL | 0 refills | Status: DC

## 2024-08-15 NOTE — ED Triage Notes (Signed)
 Pt presents with c/o low grade fevers, non-productive cough, and fatigue. Low grade fever was noted last night. It was 100.1 F tympanic. OTC Ibuprofen  +Tylenol  taken for symptoms at home with improvement. Last dose of Tylenol  was at 0915 this AM.  Feels feverish in triage room, although temp is WNL. No pain.

## 2024-08-15 NOTE — ED Provider Notes (Signed)
 [facility]    CSN: [id number] Arrival date & time: 08/15/24  1107      History   Chief Complaint Chief Complaint  Patient presents with   Fever    Low grade fever last night and light cough.  Fatigue little - Entered by patient   Fatigue    HPI Debbie Patel is a 38 y.o. female.   Patient presents to clinic over concern of fever, cough, fatigue and body aches. Symptoms started yesterday evening.   Took temp via ear thermometer and it read 1010, had ibuprofen  last night. Felt febrile and had body aches this morning so she took Tylenol .  Denies wheezing or shortness of breath  Overall symptoms are mild just feels fatigued and body aches   Patient did get flu vaccine this year   The history is provided by the patient and medical records.  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 Authorizing Provider  oseltamivir  (TAMIFLU ) 75 MG capsule Take 1 capsule (75  mg total) by mouth every 12 (twelve) hours. 08/15/24  Yes Darnell Stimson  N, FNP  acetaminophen  (TYLENOL ) 500 MG tablet Take 2 tablets (1,000 mg total) by mouth every 6 (six) hours. 09/03/21   Paul, Daniela C, CNM  albuterol  (VENTOLIN  HFA) 108 [phone] Base) MCG/ACT inhaler Inhale 2 puffs into the lungs every 4 (four) hours as needed for wheezing or shortness of breath. 07/31/24   Marinda Rocky SAILOR, MD  amitriptyline  (ELAVIL ) 10 MG tablet To Wean Amitriptyline : take 20 mg (2 tablets) by mouth nightly x 1 week, then 15 mg (1.5 tablets) nightly x 1 week, then 10 mg (1 tablet) nightly x 1 week then 5 mg (0.5 tablet) nightly x 1 week then stop. 07/31/24   Marinda Rocky SAILOR, MD  amitriptyline  (ELAVIL ) 25 MG tablet Take 1 tablet (25 mg total) by mouth at bedtime. 07/31/24   Marinda Rocky SAILOR, MD  Azelastine -Fluticasone  (DYMISTA ) 137-50 MCG/ACT SUSP Place 1 spray into both nostrils 2 (two) times daily as needed. 07/31/24   Marinda Rocky SAILOR, MD  budesonide -formoterol  (SYMBICORT ) 160-4.5 MCG/ACT inhaler Inhale 2 puffs into the lungs 2 (two)  times daily. 07/31/24   Marinda Rocky SAILOR, MD  EPINEPHrine  (EPIPEN  2-PAK) 0.3 mg/0.3 mL IJ SOAJ injection Inject 0.3 mg into the muscle as needed for anaphylaxis. Bring to your allergy  injection appointments. 12/28/23   Marinda Rocky SAILOR, MD  ibuprofen  (ADVIL ) 600 MG tablet Take 1 tablet (600 mg total) by mouth every 6 (six) hours. 09/03/21   Paul, Daniela C, CNM  montelukast  (SINGULAIR ) 10 MG tablet Take 1 tablet (10 mg total) by mouth at bedtime. 07/31/24   Marinda Rocky SAILOR, MD  SYNTHROID  200 MCG tablet Taking 200mcg daily except on Sunday taking 100mcg 07/30/21   [provider]    Family History Family History  Problem Relation Age of Onset   Stroke Mother    Esophageal cancer Father        heavy smoker and drink   Tremor Brother    Drug abuse Brother    CVA Maternal Grandmother    Alzheimer's disease Maternal Grandfather    Alzheimer's disease Paternal Grandmother    CAD Paternal  Grandfather    Allergic rhinitis Neg Hx    Asthma Neg Hx    Eczema Neg Hx    Urticaria Neg Hx     Social History Social History[1]   Allergies   Nickel   Review of Systems Review of Systems  Per HPI  Physical Exam Triage Vital Signs ED Triage Vitals  Encounter Vitals Group     BP 08/15/24 1120 121/77     Girls Systolic BP Percentile --      Girls Diastolic BP Percentile --      Boys Systolic BP Percentile --      Boys Diastolic BP Percentile --      Pulse Rate 08/15/24 1120 91     Resp 08/15/24 1120 15     Temp 08/15/24 1120 98.3 F (36.8 C)     Temp Source 08/15/24 1120 Oral     SpO2 08/15/24 1120 98 %     Weight 08/15/24 1120 195 lb (88.5 kg)     Height --      Head Circumference --      Peak Flow --      Pain Score 08/15/24 1126 0     Pain Loc --      Pain Education --      Exclude from Growth Chart --    No data found.  Updated Vital Signs BP 121/77 (BP Location: Right Arm)   Pulse 91   Temp 98.3 F (36.8 C) (Oral)   Resp 15   Wt 195 lb (88.5 kg)   LMP 07/25/2024 (Exact Date)   SpO2 98%   BMI 27.20 kg/m   Visual Acuity Right Eye Distance:   Left Eye Distance:   Bilateral Distance:    Right Eye Near:   Left Eye Near:    Bilateral Near:     Physical Exam Vitals and nursing note reviewed.  Constitutional:      Appearance: Normal appearance.  HENT:     Head: Normocephalic and atraumatic.     Right Ear: External ear normal.     Left Ear: External ear normal.     Nose: Nose normal.     Mouth/Throat:     Mouth: Mucous membranes are moist.  Eyes:     Conjunctiva/sclera: Conjunctivae normal.  Cardiovascular:     Rate and Rhythm: Normal rate.  Pulmonary:     Effort: Pulmonary effort is normal. No respiratory distress.  Neurological:  General: No focal deficit present.     Mental Status: She is alert.  Psychiatric:        Mood and Affect: Mood normal.      UC Treatments / Results  Labs (all labs ordered are listed, but only  abnormal results are displayed) Labs Reviewed  POC COVID19/FLU A&B COMBO - Abnormal; Notable for the following components:      Result Value   Influenza A Antigen, POC Positive (*)    All other components within normal limits    EKG   Radiology No results found.  Procedures Procedures (including critical care time)  Medications Ordered in UC Medications - No data to display  Initial Impression / Assessment and Plan / UC Course  I have reviewed the triage vital signs and the nursing notes.  Pertinent labs & imaging results that were available during my care of the patient were reviewed by me and considered in my medical decision making (see chart for details).  Vitals and triage reviewed, patient is hemodynamically stable.   POC testing positive for flu A. Within window for tamiflu , potential SE discussed. Symptomatic management for viral URI discussed.   Plan of care, f/u care and return precautions given, no questions at this time.     Final Clinical Impressions(s) / UC Diagnoses   Final diagnoses:  Influenza A     Discharge Instructions      You have influenza A, this is a viral illness that typically last around 5 to 7 days Taking the Tamiflu  twice daily for the next 5 days can help cut down on how long you are sick or contagious Alternate between Tylenol  and ibuprofen  every 4-6 hours to help with fever, body aches and chills You are considered contagious until you have been fever free for 24 hours without medication  Symptoms should improve within 5 to 7 days, if no improvement or any changes seek follow-up care    ED Prescriptions     Medication Sig Dispense Auth. Provider   oseltamivir  (TAMIFLU ) 75 MG capsule Take 1 capsule (75 mg total) by mouth every 12 (twelve) hours. 10 capsule Dreama Marrianne SAILOR, FNP      PDMP not reviewed this encounter.    [1]  Social History Tobacco Use   Smoking status: Never    Passive exposure: Never   Smokeless  tobacco: Never  Vaping Use   Vaping status: Never Used  Substance Use Topics   Alcohol use: Yes    Comment: occasionally   Drug use: No     Dreama, Doye Montilla  N, FNP 08/15/24 1200

## 2024-08-15 NOTE — Discharge Instructions (Signed)
 You have influenza A, this is a viral illness that typically last around 5 to 7 days Taking the Tamiflu  twice daily for the next 5 days can help cut down on how long you are sick or contagious Alternate between Tylenol  and ibuprofen  every 4-6 hours to help with fever, body aches and chills You are considered contagious until you have been fever free for 24 hours without medication  Symptoms should improve within 5 to 7 days, if no improvement or any changes seek follow-up care

## 2024-08-15 NOTE — Telephone Encounter (Signed)
 Sounds good and I hope she feels better soon.

## 2024-08-20 ENCOUNTER — Ambulatory Visit (INDEPENDENT_AMBULATORY_CARE_PROVIDER_SITE_OTHER)

## 2024-08-20 DIAGNOSIS — J309 Allergic rhinitis, unspecified: Secondary | ICD-10-CM

## 2024-08-20 DIAGNOSIS — J3089 Other allergic rhinitis: Secondary | ICD-10-CM

## 2024-08-21 ENCOUNTER — Other Ambulatory Visit: Payer: Self-pay | Admitting: Internal Medicine

## 2024-08-24 ENCOUNTER — Ambulatory Visit (INDEPENDENT_AMBULATORY_CARE_PROVIDER_SITE_OTHER)

## 2024-08-24 ENCOUNTER — Ambulatory Visit

## 2024-08-24 ENCOUNTER — Ambulatory Visit
Admission: EM | Admit: 2024-08-24 | Discharge: 2024-08-24 | Disposition: A | Discharge status: Home / Self Care | Attending: Family Medicine | Admitting: Family Medicine

## 2024-08-24 ENCOUNTER — Encounter: Payer: Self-pay | Admitting: Emergency Medicine

## 2024-08-24 ENCOUNTER — Ambulatory Visit: Payer: Self-pay | Admitting: Nurse Practitioner

## 2024-08-24 DIAGNOSIS — R051 Acute cough: Secondary | ICD-10-CM

## 2024-08-24 DIAGNOSIS — R058 Other specified cough: Secondary | ICD-10-CM

## 2024-08-24 MED ORDER — PROMETHAZINE-DM 6.25-15 MG/5ML PO SYRP: 5.0000 mL | ORAL_SOLUTION | Freq: Four times a day (QID) | ORAL | 0 refills | Status: AC | PRN

## 2024-08-24 MED ORDER — PREDNISONE 20 MG PO TABS: 40.0000 mg | ORAL_TABLET | Freq: Every day | ORAL | 0 refills | Status: AC

## 2024-08-24 NOTE — ED Provider Notes (Signed)
 " [facility]    CSN: [id number] Arrival date & time: 08/24/24  9071      History   Chief Complaint Chief Complaint  Patient presents with   Cough    HPI Debbie Patel is a 38 y.o. female  presents for evaluation of URI symptoms for 2 weeks. Patient reports associated symptoms of productive cough and wheezing. Denies N/V/D, fevers, sore throat, ear pain, body aches, congestion, shortness of breath. 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  Has been using her albuterol  inhaler and budesonide .  Patient is not an active smoker.   Pt has taken nothing OTC for symptoms.  She is traveling out of town and wants to make sure she does not have pneumonia.  Pt has no other concerns at this time.    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 Authorizing Provider  clobetasol ointment (TEMOVATE) 0.05 % Apply 1 Application topically 2 (two) times daily. 05/15/24  Yes [provider]  ipratropium (ATROVENT) 0.03 % nasal spray Place into both nostrils. 07/08/22  Yes [provider]  Multiple Vitamin (MULTI-VITAMIN) tablet Take 1 tablet by mouth daily. 10/31/19  Yes [provider]  predniSONE  (DELTASONE ) 20 MG tablet Take 2 tablets (40 mg total) by mouth daily with breakfast for 5 days. 08/24/24 08/29/24 Yes Devita Nies, Jodi R, NP  promethazine -dextromethorphan (PROMETHAZINE -DM) 6.25-15 MG/5ML syrup Take 5 mLs by mouth 4 (four) times daily as needed for cough. 08/24/24  Yes Hema Lanza, Jodi R, NP  acetaminophen  (TYLENOL ) 500 MG tablet Take 2 tablets (1,000 mg total) by mouth every 6 (six) hours. 09/03/21   Paul, Daniela C, CNM  albuterol  (VENTOLIN  HFA) 108 [phone] Base) MCG/ACT inhaler Inhale 2 puffs into the lungs every 4 (four) hours as needed for wheezing or shortness of breath. 07/31/24   Marinda Rocky SAILOR, MD  amitriptyline  (ELAVIL ) 10 MG tablet TO ETHA AMITRIPTYLINE : TAKE 20 MG (2 TABLETS) BY MOUTH NIGHTLY X 1 WEEK, THEN 15 MG (1.5  TABLETS) NIGHTLY X 1 WEEK, THEN 10 MG (1 TABLET) NIGHTLY X 1 WEEK THEN 5 MG (0.5 TABLET) NIGHTLY X 1 WEEK THEN STOP. 08/21/24   Marinda Rocky SAILOR, MD  amitriptyline  (ELAVIL ) 25 MG tablet Take 1 tablet (25 mg total) by mouth at bedtime. 07/31/24   Marinda Rocky SAILOR, MD  Azelastine -Fluticasone  (DYMISTA ) 137-50 MCG/ACT SUSP Place 1 spray into both nostrils 2 (two) times daily as needed. 07/31/24   Marinda Rocky SAILOR, MD  budesonide -formoterol  (SYMBICORT ) 160-4.5 MCG/ACT inhaler Inhale 2 puffs into the lungs 2 (two) times daily. 07/31/24   Marinda Rocky SAILOR, MD  EPINEPHrine  (EPIPEN  2-PAK) 0.3 mg/0.3 mL IJ SOAJ injection Inject 0.3 mg into the muscle as needed for anaphylaxis. Bring to your allergy  injection appointments. 12/28/23   Marinda Rocky SAILOR, MD  ibuprofen  (ADVIL ) 600 MG tablet Take 1 tablet  (600 mg total) by mouth every 6 (six) hours. 09/03/21   Paul, Daniela C, CNM  montelukast  (SINGULAIR ) 10 MG tablet Take 1 tablet (10 mg total) by mouth at bedtime. 07/31/24   Marinda Rocky SAILOR, MD  pantoprazole  (PROTONIX ) 40 MG tablet TAKE 1 TABLET BY MOUTH EVERY DAY 08/16/24   Marinda Rocky SAILOR, MD  SYNTHROID  200 MCG tablet Taking 200mcg daily except on Sunday taking 100mcg 07/30/21   [provider]    Family History Family History  Problem Relation Age of Onset   Stroke Mother    Esophageal cancer Father        heavy smoker and drink   Tremor Brother    Drug abuse Brother    CVA Maternal Grandmother    Alzheimer's disease Maternal Grandfather    Alzheimer's disease Paternal Grandmother    CAD Paternal Grandfather    Allergic rhinitis Neg Hx    Asthma Neg Hx    Eczema Neg Hx    Urticaria Neg Hx     Social History Social History[1]   Allergies   Nickel   Review of Systems Review of Systems  Respiratory:  Positive for cough and wheezing.      Physical Exam Triage Vital Signs ED Triage Vitals  Encounter Vitals Group     BP 08/24/24 1005 105/70     Girls Systolic BP Percentile --      Girls Diastolic BP Percentile --      Boys Systolic BP Percentile --      Boys Diastolic BP Percentile --      Pulse Rate 08/24/24 1005 73     Resp 08/24/24 1005 16     Temp 08/24/24 1005 98.7 F (37.1 C)     Temp Source 08/24/24 1005 Oral     SpO2 08/24/24 1005 97 %     Weight --      Height --      Head Circumference --      Peak Flow --      Pain Score 08/24/24 1003 0     Pain Loc --      Pain Education --      Exclude from Growth Chart --    No data found.  Updated Vital Signs BP 105/70 (BP Location: Right Arm)   Pulse 73   Temp 98.7 F (37.1 C) (Oral)   Resp 16   LMP 08/19/2024 (Exact Date)   SpO2 97%   Visual Acuity Right Eye Distance:   Left Eye Distance:   Bilateral Distance:    Right Eye Near:   Left Eye Near:    Bilateral Near:  Physical  Exam Vitals and nursing note reviewed.  Constitutional:      General: She is not in acute distress.    Appearance: She is well-developed. She is not ill-appearing.  HENT:     Head: Normocephalic and atraumatic.     Right Ear: Tympanic membrane and ear canal normal.     Left Ear: Tympanic membrane and ear canal normal.     Nose: No congestion.     Mouth/Throat:     Mouth: Mucous membranes are moist.     Pharynx: Oropharynx is clear. Uvula midline. No posterior oropharyngeal erythema.     Tonsils: No tonsillar exudate or tonsillar abscesses.  Eyes:     Conjunctiva/sclera: Conjunctivae normal.     Pupils: Pupils are equal, round, and reactive to light.  Cardiovascular:     Rate and Rhythm: Normal rate and regular rhythm.     Heart sounds: Normal heart sounds.  Pulmonary:     Effort: Pulmonary effort is normal.     Breath sounds: Normal breath sounds. No wheezing, rhonchi or rales.  Musculoskeletal:     Cervical back: Normal range of motion and neck supple.  Lymphadenopathy:     Cervical: No cervical adenopathy.  Skin:    General: Skin is warm and dry.  Neurological:     General: No focal deficit present.     Mental Status: She is alert and oriented to person, place, and time.  Psychiatric:        Mood and Affect: Mood normal.        Behavior: Behavior normal.      UC Treatments / Results  Labs (all labs ordered are listed, but only abnormal results are displayed) Labs Reviewed - No data to display  EKG   Radiology No results found.  Procedures Procedures (including critical care time)  Medications Ordered in UC Medications - No data to display  Initial Impression / Assessment and Plan / UC Course  I have reviewed the triage vital signs and the nursing notes.  Pertinent labs & imaging results that were available during my care of the patient were reviewed by me and considered in my medical decision making (see chart for details).     Reviewed exam and  symptoms with patient.  Wet read of x-ray without obvious consolidation, will contact for any positive results based on radiology overread once available.  Discussed likely postviral cough, will do a trial of Promethazine  DM and prednisone  daily.  Encouraged rest fluids and PCP follow-up if symptoms do not improve.  ER precautions reviewed. Final Clinical Impressions(s) / UC Diagnoses   Final diagnoses:  Acute cough  Post-viral cough syndrome     Discharge Instructions      You may take Promethazine  DM as needed for your cough.  Please of this medication can make you drowsy.  Do not drink alcohol or drive on this medication.  Start prednisone  daily for 5 days.  Lots of rest and fluids.  Please follow-up with your PCP if your symptoms do not improve.  Please go to the ER for any worsening symptoms.  Hope you feel better soon!     ED Prescriptions     Medication Sig Dispense Auth. Provider   promethazine -dextromethorphan (PROMETHAZINE -DM) 6.25-15 MG/5ML syrup Take 5 mLs by mouth 4 (four) times daily as needed for cough. 118 mL Kellye Mizner, Jodi R, NP   predniSONE  (DELTASONE ) 20 MG tablet Take 2 tablets (40 mg total) by mouth daily with breakfast for 5 days. 10  tablet Trishelle Devora, Jodi R, NP      PDMP not reviewed this encounter.     [1]  Social History Tobacco Use   Smoking status: Never    Passive exposure: Never   Smokeless tobacco: Never  Vaping Use   Vaping status: Never Used  Substance Use Topics   Alcohol use: Yes    Comment: occasionally   Drug use: No     Loreda Myla SAUNDERS, NP 08/24/24 1035  "

## 2024-08-24 NOTE — ED Triage Notes (Signed)
 Patient had the flu two weeks ago however now she still has a bad cough

## 2024-08-24 NOTE — Discharge Instructions (Addendum)
 You may take Promethazine  DM as needed for your cough.  Please of this medication can make you drowsy.  Do not drink alcohol or drive on this medication.  Start prednisone  daily for 5 days.  Lots of rest and fluids.  Please follow-up with your PCP if your symptoms do not improve.  Please go to the ER for any worsening symptoms.  Hope you feel better soon!

## 2024-08-26 ENCOUNTER — Ambulatory Visit

## 2024-08-26 DIAGNOSIS — J302 Other seasonal allergic rhinitis: Secondary | ICD-10-CM | POA: Diagnosis not present

## 2024-08-26 DIAGNOSIS — J3089 Other allergic rhinitis: Secondary | ICD-10-CM | POA: Diagnosis not present

## 2024-09-06 ENCOUNTER — Encounter: Admitting: Family

## 2024-09-09 ENCOUNTER — Ambulatory Visit

## 2024-09-09 DIAGNOSIS — J302 Other seasonal allergic rhinitis: Secondary | ICD-10-CM

## 2024-09-12 DIAGNOSIS — J3089 Other allergic rhinitis: Secondary | ICD-10-CM | POA: Diagnosis not present

## 2024-09-12 DIAGNOSIS — J302 Other seasonal allergic rhinitis: Secondary | ICD-10-CM | POA: Diagnosis not present

## 2024-09-12 DIAGNOSIS — J301 Allergic rhinitis due to pollen: Secondary | ICD-10-CM | POA: Diagnosis not present

## 2024-09-12 DIAGNOSIS — J3081 Allergic rhinitis due to animal (cat) (dog) hair and dander: Secondary | ICD-10-CM | POA: Diagnosis not present

## 2024-09-12 NOTE — Progress Notes (Signed)
 VIALS MADE ON 09/12/24

## 2024-09-13 ENCOUNTER — Other Ambulatory Visit: Payer: Self-pay | Admitting: Internal Medicine

## 2024-09-27 ENCOUNTER — Ambulatory Visit

## 2024-09-27 DIAGNOSIS — J302 Other seasonal allergic rhinitis: Secondary | ICD-10-CM | POA: Diagnosis not present

## 2024-10-02 ENCOUNTER — Ambulatory Visit

## 2024-10-02 DIAGNOSIS — J302 Other seasonal allergic rhinitis: Secondary | ICD-10-CM | POA: Diagnosis not present

## 2024-10-04 ENCOUNTER — Telehealth: Payer: Self-pay | Admitting: Family

## 2024-10-04 DIAGNOSIS — E063 Autoimmune thyroiditis: Secondary | ICD-10-CM

## 2024-10-04 NOTE — Telephone Encounter (Signed)
 Copied from CRM [id number]. Topic: Referral - Request for Referral >> Oct 04, 2024  2:16 PM Terri G wrote: Did the patient discuss referral with their provider in the last year? Yes (If No - schedule appointment) (If Yes - send message)  Appointment offered? No  Type of order/referral and detailed reason for visit: To see a Endocrinologist   Preference of office, provider, location: [facility] [address]  [address]  [place], [place] [address] for [facility]  If referral order, have you been seen by this specialty before? No (If Yes, this issue or another issue? When? Where?  Can we respond through MyChart? Yes

## 2024-10-08 ENCOUNTER — Encounter: Admitting: Family
